# Patient Record
Sex: Male | Born: 1965 | Race: White | Hispanic: No | Marital: Married | Smoking: Former smoker
Health system: Southern US, Community
[De-identification: ages and names within clinical notes are randomized; demographics above are authoritative.]

## PROBLEM LIST (undated history)

## (undated) DIAGNOSIS — G9619 Other disorders of meninges, not elsewhere classified: Secondary | ICD-10-CM

## (undated) DIAGNOSIS — G96198 Other disorders of meninges, not elsewhere classified: Secondary | ICD-10-CM

## (undated) DIAGNOSIS — M1990 Unspecified osteoarthritis, unspecified site: Secondary | ICD-10-CM

## (undated) DIAGNOSIS — Z87442 Personal history of urinary calculi: Secondary | ICD-10-CM

## (undated) HISTORY — PX: SHOULDER ARTHROSCOPY: SHX128

## (undated) HISTORY — PX: HAND EXPLORATION: SHX1725

## (undated) HISTORY — DX: Other disorders of meninges, not elsewhere classified: G96.19

## (undated) HISTORY — PX: BACK SURGERY: SHX140

## (undated) HISTORY — DX: Other disorders of meninges, not elsewhere classified: G96.198

## (undated) HISTORY — PX: I & D EXTREMITY: SHX5045

## (undated) HISTORY — PX: FOOT SURGERY: SHX648

---

## 2000-07-17 ENCOUNTER — Ambulatory Visit (HOSPITAL_COMMUNITY): Admission: RE | Admit: 2000-07-17 | Discharge: 2000-07-17 | Payer: Self-pay | Admitting: Neurosurgery

## 2002-01-25 ENCOUNTER — Emergency Department (HOSPITAL_COMMUNITY): Admission: EM | Admit: 2002-01-25 | Discharge: 2002-01-25 | Payer: Self-pay | Admitting: Emergency Medicine

## 2015-12-08 ENCOUNTER — Emergency Department (HOSPITAL_COMMUNITY): Payer: BLUE CROSS/BLUE SHIELD

## 2015-12-08 ENCOUNTER — Encounter (HOSPITAL_COMMUNITY): Payer: Self-pay | Admitting: Emergency Medicine

## 2015-12-08 ENCOUNTER — Emergency Department (HOSPITAL_COMMUNITY)
Admission: EM | Admit: 2015-12-08 | Discharge: 2015-12-08 | Disposition: A | Discharge status: Home / Self Care | Payer: BLUE CROSS/BLUE SHIELD | Attending: Emergency Medicine | Admitting: Emergency Medicine

## 2015-12-08 DIAGNOSIS — W1830XA Fall on same level, unspecified, initial encounter: Secondary | ICD-10-CM | POA: Insufficient documentation

## 2015-12-08 DIAGNOSIS — Y939 Activity, unspecified: Secondary | ICD-10-CM | POA: Diagnosis not present

## 2015-12-08 DIAGNOSIS — M25561 Pain in right knee: Secondary | ICD-10-CM | POA: Diagnosis not present

## 2015-12-08 DIAGNOSIS — Y999 Unspecified external cause status: Secondary | ICD-10-CM | POA: Diagnosis not present

## 2015-12-08 DIAGNOSIS — Z87891 Personal history of nicotine dependence: Secondary | ICD-10-CM | POA: Diagnosis not present

## 2015-12-08 DIAGNOSIS — Y929 Unspecified place or not applicable: Secondary | ICD-10-CM | POA: Insufficient documentation

## 2015-12-08 MED ORDER — DICLOFENAC SODIUM 75 MG PO TBEC: 75.0000 mg | DELAYED_RELEASE_TABLET | Freq: Two times a day (BID) | ORAL | 0 refills | Status: DC

## 2015-12-08 MED ORDER — OXYCODONE-ACETAMINOPHEN 5-325 MG PO TABS
1.0000 | ORAL_TABLET | Freq: Once | ORAL | Status: AC
  Administered 2015-12-08: 1 via ORAL
  Filled 2015-12-08: qty 1

## 2015-12-08 NOTE — Discharge Instructions (Signed)
Apply ice packs on/off to your knee.  Wear the ACE wrap when weight bearing, do not wear it continuously.  Follow-up with the orthopedic doctor listed.

## 2015-12-08 NOTE — ED Triage Notes (Signed)
Pt reports R knee edema and pain. Pt fell yesterday after stepping off the edge of a curb. Pt states edema had started prior to his fall.

## 2015-12-08 NOTE — ED Provider Notes (Signed)
[facility] DEPT Provider Note   CSN: [id number] Arrival date & time: 12/08/15  1148   By signing my name below, I, Dolores Hoose, attest that this documentation has been prepared under the direction and in the presence of non-physician practitioner, Kem Parkinson, PA-C. Electronically Signed: Dolores Hoose, Scribe. 12/08/2015. 12:57 PM.  History   Chief Complaint Chief Complaint  Patient presents with  . Knee Pain   The history is provided by the patient. No language interpreter was used.    HPI Comments:  Edward Barrett is a 50 y.o. male with no pertinent PMHx who presents to the Emergency Department complaining of chronic waxing/waning right knee pain onset 3 days ago. Pt reports the pain feels like a "constant cramp" in the posterior aspect of his right knee. Pt states that his knee pain is baseline, but began hurting more 3 days ago. He states that it was not as bad 2 days ago, but after walking on it and falling yesterday, his pain has become particularly bad. Pt states that during his fall yesterday, he remembers hearing a pop. No modifying factors indicated. He endorses associated swelling to the area. Pt denies any numbness or weakness.   History reviewed. No pertinent past medical history.  There are no active problems to display for this patient.  Past Surgical History:  Procedure Laterality Date  . BACK SURGERY    . I&D EXTREMITY     hand  . SHOULDER ARTHROSCOPY      Home Medications    Prior to Admission medications   Medication Sig Start Date End Date Taking? Authorizing Provider  diclofenac (VOLTAREN) 75 MG EC tablet Take 1 tablet (75 mg total) by mouth 2 (two) times daily. Take with food 12/08/15   Tammy Triplett, PA-C  Oxycodone-Acetaminophen (PERCOCET PO) Take by mouth.    Historical Provider, MD    Family History Family History  Problem Relation Age of Onset  . Cancer Father   . Cancer Sister   . Diabetes Other   . Cancer Other     Social  History Social History  Substance Use Topics  . Smoking status: Former Smoker    Years: 1.50  . Smokeless tobacco: Former Systems developer  . Alcohol use No     Allergies   Review of patient's allergies indicates no known allergies.   Review of Systems Review of Systems  Musculoskeletal: Positive for arthralgias and joint swelling.  Neurological: Negative for weakness and numbness.     Physical Exam Updated Vital Signs BP 140/75 (BP Location: Left Arm)   Pulse (!) 58   Temp 99 F (37.2 C) (Oral)   Resp 16   Ht 6' (1.829 m)   Wt 180 lb (81.6 kg)   SpO2 100%   BMI 24.41 kg/m   Physical Exam  Constitutional: He is oriented to person, place, and time. He appears well-developed and well-nourished. No distress.  HENT:  Head: Normocephalic and atraumatic.  Eyes: Conjunctivae are normal.  Cardiovascular: Normal rate.   Pulmonary/Chest: Effort normal.  Abdominal: He exhibits no distension.  Musculoskeletal:  Tenderness to popliteal fossa of right knee. Mild to moderate edema without erythema or excessive warmth. Calf soft and nontender.  Neurological: He is alert and oriented to person, place, and time.  Skin: Skin is warm and dry.  Psychiatric: He has a normal mood and affect.  Nursing note and vitals reviewed.  ED Treatments / Results  DIAGNOSTIC STUDIES:  Oxygen Saturation is 100% on RA, normal by my interpretation.  COORDINATION OF CARE:  1:09 PM Discussed treatment plan with pt at bedside which includes referral to orthopedics, a brace, ice and pain management and pt agreed to plan.  Labs (all labs ordered are listed, but only abnormal results are displayed) Labs Reviewed - No data to display  EKG  EKG Interpretation None       Radiology No results found. Procedures Procedures (including critical care time)  Medications Ordered in ED Medications  oxyCODONE-acetaminophen (PERCOCET/ROXICET) 5-325 MG per tablet 1 tablet (1 tablet Oral Given 12/08/15 1343)      Initial Impression / Assessment and Plan / ED Course  I have reviewed the triage vital signs and the nursing notes.  Pertinent labs & imaging results that were available during my care of the patient were reviewed by me and considered in my medical decision making (see chart for details).  Clinical Course     Patient X-Ray negative for obvious fracture or dislocation.  Pt advised to follow up with orthopedics. Patient given brace while in ED, conservative therapy recommended and discussed. Patient will be discharged home & is agreeable with above plan. Returns precautions discussed. Pt appears safe for discharge.  Final Clinical Impressions(s) / ED Diagnoses   Final diagnoses:  Knee pain, acute, right    New Prescriptions Discharge Medication List as of 12/08/2015  1:56 PM    START taking these medications   Details  diclofenac (VOLTAREN) 75 MG EC tablet Take 1 tablet (75 mg total) by mouth 2 (two) times daily. Take with food, Starting Sun 12/08/2015, Print      I personally performed the services described in this documentation, which was scribed in my presence. The recorded information has been reviewed and is accurate.      Nat Christen, MD 12/10/15 [phone]

## 2015-12-10 ENCOUNTER — Encounter: Payer: Self-pay | Admitting: Orthopedic Surgery

## 2015-12-10 ENCOUNTER — Ambulatory Visit (INDEPENDENT_AMBULATORY_CARE_PROVIDER_SITE_OTHER): Payer: BLUE CROSS/BLUE SHIELD | Admitting: Orthopedic Surgery

## 2015-12-10 VITALS — BP 138/79 | HR 61 | Wt 175.0 lb

## 2015-12-10 DIAGNOSIS — M1120 Other chondrocalcinosis, unspecified site: Secondary | ICD-10-CM

## 2015-12-10 DIAGNOSIS — M25461 Effusion, right knee: Secondary | ICD-10-CM | POA: Diagnosis not present

## 2015-12-10 NOTE — Progress Notes (Signed)
Chief Complaint  Patient presents with  . Joint Swelling    right knee swelling   HPI 50 year old male started having some swelling in his knee which had prior been painful but no swelling. He then slipped off a curb felt a pop in his knee and started having pain swelling loss of motion and he's been experiencing symptoms now for about 2 weeks. Presents for evaluation and treatment. X-rays at the hospital show chondrocalcinosis mild arthritis   Review of Systems  Constitutional: Negative for chills and fever.  Musculoskeletal: Positive for back pain, joint pain, myalgias and neck pain.  Skin: Negative for itching and rash.    He denies any medical problems hypertension diabetes  Past Surgical History:  Procedure Laterality Date  . BACK SURGERY    . I&D EXTREMITY     hand  . SHOULDER ARTHROSCOPY     Family History  Problem Relation Age of Onset  . Cancer Father   . Cancer Sister   . Diabetes Other   . Cancer Other    Social History  Substance Use Topics  . Smoking status: Former Smoker    Years: 1.50  . Smokeless tobacco: Former Systems developer  . Alcohol use No   Current Meds  Medication Sig  . diclofenac (VOLTAREN) 75 MG EC tablet Take 1 tablet (75 mg total) by mouth 2 (two) times daily. Take with food  . Oxycodone-Acetaminophen (PERCOCET PO) Take by mouth.    BP 138/79   Pulse 61   Wt 175 lb (79.4 kg)   BMI 23.73 kg/m   Physical Exam  Constitutional: He is oriented to person, place, and time. He appears well-developed and well-nourished. No distress.  Cardiovascular: Normal rate and intact distal pulses.   Neurological: He is alert and oriented to person, place, and time.  Skin: Skin is warm and dry. No rash noted. He is not diaphoretic. No erythema. No pallor.  Psychiatric: He has a normal mood and affect. His behavior is normal. Judgment and thought content normal.    Ortho Exam Slight abnormal gait. Right knee Large knee effusion. Knee flexion 10-180 stable  strength normal skin intact no erythema pulses good distally sensation normal  Left knee full range of motion  ASSESSMENT: My personal interpretation of the images:  He does have chondrocalcinosis both knees    PLAN Aspiration injection  Follow-up with [place] as needed  Use ice 3 times a day  Procedure note injection and aspiration right knee joint  Verbal consent was obtained to aspirate and inject the right knee joint   Timeout was completed to confirm the site of aspiration and injection  An 18-gauge needle was used to aspirate the knee joint from a suprapatellar lateral approach.  The medications used were 40 mg of Depo-Medrol and 1% lidocaine 3 cc  Anesthesia was provided by ethyl chloride and the skin was prepped with alcohol.  After cleaning the skin with alcohol an 18-gauge needle was used to aspirate the right knee joint.  We obtained 70 cc of cloudy yellow fluid nonpurulent consistent with chondrocalcinosis We follow this by injection of 40 mg of Depo-Medrol and 3 cc 1% lidocaine.  There were no complications. A sterile bandage was applied.    Arther Abbott, MD 12/10/2015 11:25 AM  .meds

## 2015-12-10 NOTE — Patient Instructions (Addendum)
Calcium Pyrophosphate Deposition   Calcium pyrophosphate deposition (CPPD), which is also called pseudogout, is a type of arthritis that causes pain, swelling, and inflammation in a joint. The joint pain can be severe and may last for days. If it is not treated, the pain may last much longer. Attacks of CPPD may come and go. This condition usually affects one joint at a time. The joints that are affected most commonly are the knees, but this condition can also affect the wrists, elbows, shoulders, or ankles.  CPPD is similar to gout. Both conditions result from the buildup of crystals in the joint. However, CPPD is caused by a type of crystal that is different than the crystals that cause gout.  CAUSES  This condition is caused by the buildup of calcium pyrophosphate dihydrate crystals in the joint. The reason why this buildup occurs is not known. The condition may be passed down from parent to child (hereditary).  RISK FACTORS  This condition is more likely to develop in people who:  · Are over 60 years old.  · Have a family history of the condition.  · Have had joint replacement surgery.  · Have had a recent injury.  · Have certain medical conditions, such as hemophilia, ochronosis, amyloidosis, or hormonal disorders.  · Have low blood magnesium levels.  SYMPTOMS  Symptoms of this condition include:  · Pain in a joint. The pain may:    Be intense and constant.    Come on quickly.    Get worse with movement.    Last from several days to a few weeks.  · Redness, swelling, and warmth at the joint.  · Stiffness of the joint.  DIAGNOSIS  To diagnose this condition, your health care provider will use a needle to remove fluid from the joint. The fluid will be examined under a microscope to check for the crystals that cause CPPD. You may also have imaging tests, such as:  · X-rays.  · Ultrasound.  TREATMENT  There is no way to remove the crystals from the joint and no way to cure this condition. However, treatment can  relieve symptoms and improve joint function. Treatment may include:  · Nonsteroidal anti-inflammatory drugs (NSAIDs) to reduce inflammation and pain.  · Medicines to help prevent attacks.  · Injections of medicine (cortisone) into the joint to reduce pain and swelling.  · Physical therapy to improve joint function.  HOME CARE INSTRUCTIONS  · Take medicines only as directed by your health care provider.  · Rest the affected joints until your symptoms start to go away.  · Keep your affected joints raised (elevated) when possible. This will help to reduce swelling.  · If directed, apply ice to the affected area:    Put ice in a plastic bag.    Place a towel between your skin and the bag.    Leave the ice on for 20 minutes, 2-3 times per day.  · If the painful joint is in your leg, use crutches as directed by your health care provider.  · When your symptoms start to go away, begin to exercise regularly or do physical therapy. Talk with your health care provider or physical therapist about what types of exercise are safe for you. Low-impact exercise may be best. This includes walking, swimming, bicycling, and water aerobics.  · Maintain a healthy weight so your joints do not need to bear more weight than necessary.  SEEK MEDICAL CARE IF:  · You have an increase   any questions you have with your health care provider.   Document Released: 11/23/2003 Document Revised: 07/17/2014 Document Reviewed: 02/07/2014 Elsevier Interactive Patient Education 2016 [employer].   ICE 3 X A DAY FOR 20 MIN

## 2016-04-07 ENCOUNTER — Ambulatory Visit (HOSPITAL_COMMUNITY): Payer: BLUE CROSS/BLUE SHIELD | Attending: Orthopedic Surgery

## 2016-04-07 ENCOUNTER — Encounter (HOSPITAL_COMMUNITY): Payer: Self-pay

## 2016-04-07 DIAGNOSIS — M542 Cervicalgia: Secondary | ICD-10-CM | POA: Insufficient documentation

## 2016-04-07 DIAGNOSIS — R29898 Other symptoms and signs involving the musculoskeletal system: Secondary | ICD-10-CM | POA: Diagnosis present

## 2016-04-07 DIAGNOSIS — M25511 Pain in right shoulder: Secondary | ICD-10-CM | POA: Diagnosis present

## 2016-04-07 NOTE — Patient Instructions (Signed)
  Flexibility: Upper Trapezius Stretch   Gently grasp right side of head while reaching behind back with other hand. Tilt head away until a gentle stretch is felt. Hold _10___ seconds. Repeat __2__ times per set.   http://orth.exer.us/340   Levator Stretch   Grasp seat or sit on hand on side to be stretched. Turn head toward other side and look down. Use hand on head to gently stretch neck in that position. Hold __10__ seconds. Repeat on other side. Repeat __2__ times.   http://[email or web address].exer.us/30   Scapular Retraction (Standing)   With arms at sides, pinch shoulder blades together. Repeat __10__ times per set. Do __1__ sets per session. Do ____ sessions per day.  http://orth.exer.us/944   Flexibility: Neck Retraction   Pull head straight back, keeping eyes and jaw level. Repeat __10__ times per set. Do _1___ sets per session. Do ____ sessions per day.  http://orth.exer.us/344   Posture - Sitting   Sit upright, head facing forward. Try using a roll to support lower back. Keep shoulders relaxed, and avoid rounded back. Keep hips level with knees. Avoid crossing legs for long periods.    Doorway Stretch  Place each hand opposite each other on the doorway. (You can change where you feel the stretch by moving arms higher or lower.) Step through with one foot and bend front knee until a stretch is felt and hold. Step through with the opposite foot on the next rep. Hold for __10___ seconds. Repeat __2__times.      Posterior Capsule Stretch   Stand or sit, one arm across body so hand rests over opposite shoulder. Gently push on crossed elbow with other hand until stretch is felt in shoulder of crossed arm. Hold _10__ seconds.  Repeat _2__ times per session. Do ___ sessions per day.

## 2016-04-07 NOTE — Therapy (Signed)
[facility] [place], [place], [address] Phone: [phone]   Fax:  [phone]  Occupational Therapy Evaluation  Patient Details  Name: Edward Barrett MRN: [id number] Date of Birth: 1965-05-04 Referring Provider: Melina Schools, MD  Encounter Date: 04/07/2016      OT End of Session - 04/07/16 0903    Visit Number 1   Number of Visits 4   Date for OT Re-Evaluation 05/07/16   Authorization Type BCBS - insurance benefits unknown at time of evaluation.   OT Start Time 0818   OT Stop Time 0900   OT Time Calculation (min) 42 min   Activity Tolerance Patient tolerated treatment well   Behavior During Therapy WFL for tasks assessed/performed      History reviewed. No pertinent past medical history.  Past Surgical History:  Procedure Laterality Date  . BACK SURGERY    . I&D EXTREMITY     hand  . SHOULDER ARTHROSCOPY      There were no vitals filed for this visit.      Subjective Assessment - 04/07/16 0856    Subjective  S: My shoulder just started hurting a couple months ago.   Patient is accompained by: Family member  wife: Edward Barrett   Pertinent History Patient is a 51 y/o male S/P right shoulder and cervical pain related to DDD which began to approximately 2-3 months ago. Patient reports pain mainly at night when he is sleeping with pain ranging from 3-9/10 at night. Dr. Rolena Infante has referred patient to occupational therapy for evaluation and treatment.    Special Tests FOTO score: 65/100   Patient Stated Goals To decrease pain level.    Currently in Pain? Yes   Pain Score 1    Pain Location Shoulder   Pain Orientation Right   Pain Descriptors / Indicators Aching;Constant   Pain Type Acute pain   Pain Radiating Towards N/A   Pain Onset More than a month ago   Pain Frequency Constant   Aggravating Factors  Sleeping position   Pain Relieving Factors Unknown   Effect of Pain on Daily Activities Min effect   Multiple  Pain Sites No           [facility] OT Assessment - 04/07/16 0807      Assessment   Diagnosis RIght shoulder pain   Referring Provider Melina Schools, MD   Onset Date --  2-3 months ago   Assessment Follow up appointment scheduled for 04/09/16   Prior Therapy None for right shoulder     Precautions   Precautions None     Restrictions   Weight Bearing Restrictions No     Balance Screen   Has the patient fallen in the past 6 months Yes   How many times? 1   Has the patient had a decrease in activity level because of a fear of falling?  No   Is the patient reluctant to leave their home because of a fear of falling?  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 Strength is at baseline and functional. Pt does report difficulty with sleeping. patient is only able to sleep  on his back and he reports that pain level at night ranges from 3-9 out of 10 which impairs his ability to get a good night sleep.     Mobility   Mobility Status Independent     Written Expression   Dominant Hand Right     Vision - History   Baseline Vision Wears glasses all the time     Cognition   Overall Cognitive Status Within Functional Limits for tasks assessed     ROM / Strength   AROM / PROM / Strength Strength;PROM;AROM     Palpation   Palpation comment Max fascial restrictions in right anterior deltoid, trapezius, and scapularis region.     AROM   Overall AROM Comments Assessed seated. IR/er abducted. Cervical A/ROM is WNL.   AROM Assessment Site Shoulder   Right/Left Shoulder Right   Right Shoulder Flexion 165 Degrees   Right Shoulder ABduction 145 Degrees   Right Shoulder Internal Rotation 70 Degrees   Right  Shoulder External Rotation 85 Degrees     PROM   Overall PROM  Within functional limits for tasks performed   PROM Assessment Site Shoulder   Right/Left Shoulder Right     Strength   Overall Strength Comments Assessed seated. IR/er abducted.   Strength Assessment Site Shoulder   Right/Left Shoulder Right   Right Shoulder Flexion 5/5   Right Shoulder ABduction 5/5   Right Shoulder Internal Rotation 5/5   Right Shoulder External Rotation 5/5                         OT Education - 04/07/16 0901    Education provided Yes   Education Details cervical and shoulder stretches   Person(s) Educated Patient;Spouse   Methods Explanation;Demonstration;Verbal cues;Handout   Comprehension Returned demonstration;Verbalized understanding          OT Short Term Goals - 04/07/16 0935      OT SHORT TERM GOAL #1   Title Patient will be educated and independent with HEP to increase comfort level when sleeping.   Time 4   Period Weeks   Status New     OT SHORT TERM GOAL #2   Title Patient will be educated on proper Arts development officer and posture when competing daily and work related tasks.   Time 4   Period Weeks   Status New     OT SHORT TERM GOAL #3   Title Patient will report a decreased pain level when sleeping that will allow him to be able to sleep comfortably for 50% of the night or more.   Time 4   Period Weeks   Status New     OT SHORT TERM GOAL #4   Title Patient will decrease fascial restrictions in RUE to min amount or less to help decrease pain level when sleeping.    Time 4   Period Weeks   Status New                  Plan - 04/07/16 0904    Clinical Impression Statement A: Patient is a 51 y/o male S/P right shoulder and cervical pain causing increased fascial restrictions and pain which is resulting in difficulty gaining a comfortable position when sleeping. UB strength and ROM is functional.   Rehab Potential Excellent   Clinical Impairments  Affecting Rehab Potential Patient has a history of left shoulder arthroscopy and has limited shoulder mobility at baseline. Hx of risk for left shoulder dislocation.  OT Frequency 1x / week   OT Duration 4 weeks   OT Treatment/Interventions Self-care/ADL training;Therapeutic exercise;Patient/family education;Manual Therapy;Ultrasound;Iontophoresis;Therapeutic activities;DME and/or AE instruction;Cryotherapy;Electrical Stimulation;Moist Heat;Passive range of motion   Plan P: Patient will benefit from skilled OT services to increase functional performance and comfort level during daily/work related tasks and allow for an increased comfort level at night when sleeping. Treatment Plan: Myofascial release to right UE and right side cervical region. Complete manual stretching, Cervical and shoulder stretching follow up with HEP. Education on Designer, fashion/clothing at work.   OT Home Exercise Plan 1/23: cervical and shoulder stretches.   Consulted and Agree with Plan of Care Patient;Family member/caregiver   Family Member Consulted Wife, Edward Barrett.      Patient will benefit from skilled therapeutic intervention in order to improve the following deficits and impairments:  Pain, Increased fascial restricitons  Visit Diagnosis: Cervicalgia - Plan: Ot plan of care cert/re-cert  Acute pain of right shoulder - Plan: Ot plan of care cert/re-cert  Other symptoms and signs involving the musculoskeletal system - Plan: Ot plan of care cert/re-cert    Problem List There are no active problems to display for this patient.  Edward Barrett, OTR/L,CBIS  [phone]  04/07/2016, 9:49 AM  [facility] [address] [place], [place], [address] Phone: [phone]   Fax:  [phone]  Name: CAMRYN EISENREICH MRN: [id number] Date of Birth: 1965-04-05

## 2016-04-08 ENCOUNTER — Ambulatory Visit (HOSPITAL_COMMUNITY): Payer: BLUE CROSS/BLUE SHIELD

## 2016-04-14 ENCOUNTER — Telehealth (HOSPITAL_COMMUNITY): Payer: Self-pay | Admitting: Family Medicine

## 2016-04-14 NOTE — Telephone Encounter (Signed)
04/14/16 wife called to cx the 1/31 appointment.  She said that it wasn't going to be a good day to come like they thought it would be

## 2016-04-15 ENCOUNTER — Ambulatory Visit (HOSPITAL_COMMUNITY): Payer: BLUE CROSS/BLUE SHIELD | Admitting: Occupational Therapy

## 2016-04-17 ENCOUNTER — Telehealth (HOSPITAL_COMMUNITY): Payer: Self-pay | Admitting: Family Medicine

## 2016-04-17 NOTE — Telephone Encounter (Signed)
04/17/16 Beth texted me asking me to move from 9am to 1:45 and put on Laura's schedule.  I made the change and called the patient and left a message letting him know about the change and asking that he please call back to confirm.    MM

## 2016-04-20 ENCOUNTER — Ambulatory Visit (HOSPITAL_COMMUNITY): Payer: BLUE CROSS/BLUE SHIELD

## 2016-04-20 ENCOUNTER — Telehealth (HOSPITAL_COMMUNITY): Payer: Self-pay | Admitting: Family Medicine

## 2016-04-20 NOTE — Telephone Encounter (Signed)
04/20/16 wife called to say that he couldn't do the 1:45 today so we rescheduled to 8:15 on 2/7

## 2016-04-22 ENCOUNTER — Encounter (HOSPITAL_COMMUNITY): Payer: Self-pay

## 2016-04-22 ENCOUNTER — Ambulatory Visit (HOSPITAL_COMMUNITY): Payer: BLUE CROSS/BLUE SHIELD | Attending: Orthopedic Surgery

## 2016-04-22 DIAGNOSIS — M25511 Pain in right shoulder: Secondary | ICD-10-CM

## 2016-04-22 DIAGNOSIS — R29898 Other symptoms and signs involving the musculoskeletal system: Secondary | ICD-10-CM | POA: Diagnosis present

## 2016-04-22 DIAGNOSIS — M542 Cervicalgia: Secondary | ICD-10-CM | POA: Insufficient documentation

## 2016-04-22 NOTE — Therapy (Addendum)
[place], [place], [address] Phone: [phone]   Fax:  [phone]  Occupational Therapy Treatment  Patient Details  Name: Edward Barrett MRN: [id number] Date of Birth: 08/09/1965 Referring Provider: Melina Schools, MD  Encounter Date: 04/22/2016      OT End of Session - 04/22/16 0903    Visit Number 2   Number of Visits 4   Date for OT Re-Evaluation 05/07/16   Authorization Type BCBS - insurance benefits unknown at time of evaluation.   OT Start Time [phone]   OT Stop Time 0900   OT Time Calculation (min) 44 min   Activity Tolerance Patient tolerated treatment well   Behavior During Therapy [facility] for tasks assessed/performed      History reviewed. No pertinent past medical history.  Past Surgical History:  Procedure Laterality Date  . BACK SURGERY    . I&D EXTREMITY     hand  . SHOULDER ARTHROSCOPY      There were no vitals filed for this visit.      Subjective Assessment - 04/22/16 0835    Subjective  S: I haven't been doing my exercises as consistantly as I should because of the two jobs.    Currently in Pain? No/denies            [facility] OT Assessment - 04/22/16 1448      Assessment   Diagnosis RIght shoulder pain     Precautions   Precautions None                  OT Treatments/Exercises (OP) - 04/22/16 0838      Exercises   Exercises Shoulder;Neck     Neck Exercises: Stretches   Upper Trapezius Stretch 2 reps;10 seconds     Neck Exercises: Seated   Money 10 reps     Shoulder Exercises: Standing   Extension Theraband;12 reps   Theraband Level (Shoulder Extension) Level 3 (Green)   Retraction Theraband;12 reps   Theraband Level (Shoulder Retraction) Level 3 (Green)   Other Standing Exercises Y arms facing wall; 10X     Shoulder Exercises: ROM/Strengthening   UBE (Upper Arm Bike) Level 3 3' reverse only   X to V Arms 10X with right only     Shoulder Exercises:  Stretch   Other Shoulder Stretches --   Other Shoulder Stretches Doorway stretch; 10 second holds 2 times.                OT Education - 04/22/16 [phone]    Education provided Yes   Education Details Patient was given print out of OT evaluation. Reviewed goals and plan of care.   Person(s) Educated Patient   Methods Explanation;Handout   Comprehension Verbalized understanding          OT Short Term Goals - 04/22/16 0907      OT SHORT TERM GOAL #1   Title Patient will be educated and independent with HEP to increase comfort level when sleeping.   Time 4   Period Weeks   Status On-going     OT SHORT TERM GOAL #2   Title Patient will be educated on proper Arts development officer and posture when competing daily and work related tasks.   Time 4   Period Weeks   Status On-going     OT SHORT TERM GOAL #3   Title Patient will report a decreased pain level when sleeping that will allow him to be able to  sleep comfortably for 50% of the night or more.   Time 4   Period Weeks   Status On-going     OT SHORT TERM GOAL #4   Title Patient will decrease fascial restrictions in RUE to min amount or less to help decrease pain level when sleeping.    Time 4   Period Weeks   Status On-going                  Plan - 04/22/16 8280    Clinical Impression Statement A: Patient is unable to complete prone exercises due to back pain. Encouraged patient to make more of an effort to complete stretches as he is only completing therapy once a week. Patient reports more pain on left side of shoulder versus right side this session. Patient with max fascial restrictions in right upper trapezius and anterior deltoid region. VC needed for form and technique. Session focused on increasing shoulder retraction to promote good posture during work tasks and decrease amount of weight on cervical region.   Plan P: Follow up on HEP completion. Continue to focus on decreasing fascial restrictions. Work on  good Neurosurgeon.       Patient will benefit from skilled therapeutic intervention in order to improve the following deficits and impairments:  Pain, Increased fascial restricitons  Visit Diagnosis: Cervicalgia  Acute pain of right shoulder  Other symptoms and signs involving the musculoskeletal system    Problem List There are no active problems to display for this patient.    Ailene Ravel, OTR/L,CBIS  [phone]  04/22/2016, 9:09 AM  [place] [address] [place], [place], [address] Phone: [phone]   Fax:  [phone]  Name: Edward Barrett MRN: [id number] Date of Birth: 30-Sep-1965   OCCUPATIONAL THERAPY DISCHARGE SUMMARY  Visits from Start of Care: 2  Current functional level related to goals / functional outcomes: No goals met. Patient did not return after second treatment session. No reason given.   Remaining deficits: All deficits remain.   Education / Equipment: See above Plan: Patient agrees to discharge.  Patient goals were not met. Patient is being discharged due to not returning since the last visit.  ?????         Ailene Ravel, OTR/L,CBIS  [phone]

## 2016-04-27 ENCOUNTER — Ambulatory Visit (HOSPITAL_COMMUNITY): Payer: BLUE CROSS/BLUE SHIELD | Admitting: Specialist

## 2016-05-05 ENCOUNTER — Ambulatory Visit (HOSPITAL_COMMUNITY): Payer: BLUE CROSS/BLUE SHIELD | Admitting: Specialist

## 2016-08-14 ENCOUNTER — Ambulatory Visit: Payer: Self-pay | Admitting: Physician Assistant

## 2016-08-18 ENCOUNTER — Ambulatory Visit: Payer: Self-pay | Admitting: Physician Assistant

## 2016-08-20 ENCOUNTER — Ambulatory Visit: Payer: Self-pay | Admitting: Physician Assistant

## 2016-09-01 NOTE — Pre-Procedure Instructions (Signed)
Edward Barrett  09/01/2016      [name] - [name], [name] [address] Phone: [phone] Fax: [phone]    Your procedure is scheduled on June 28  Report to [place] at [place].M.  Call this number if you have problems the morning of surgery:  [phone]   Remember:  Do not eat food or drink liquids after midnight.   Take these medicines the morning of surgery with A SIP OF WATER Oxycodone if needed  7 days prior to surgery STOP taking any Aspirin, Aleve, Naproxen, Ibuprofen, Motrin, Advil, Goody's, BC's, all herbal medications, fish oil, and all vitamins    Do not wear jewelry  Do not wear lotions, powders, or cologne, or deoderant.   Men may shave face and neck.  Do not bring valuables to the hospital.  [facility] is not responsible for any belongings or valuables.  Contacts, dentures or bridgework may not be worn into surgery.  Leave your suitcase in the car.  After surgery it may be brought to your room.  For patients admitted to the hospital, discharge time will be determined by your treatment team.  Patients discharged the day of surgery will not be allowed to drive home.    Special instructions:   [place]- Preparing For Surgery  Before surgery, you can play an important role. Because skin is not sterile, your skin needs to be as free of germs as possible. You can reduce the number of germs on your skin by washing with CHG (chlorahexidine gluconate) Soap before surgery.  CHG is an antiseptic cleaner which kills germs and bonds with the skin to continue killing germs even after washing.  Please do not use if you have an allergy to CHG or antibacterial soaps. If your skin becomes reddened/irritated stop using the CHG.  Do not shave (including legs and underarms) for at least 48 hours prior to first CHG shower. It is OK to shave your face.  Please follow these instructions  carefully.   1. Shower the NIGHT BEFORE SURGERY and the MORNING OF SURGERY with CHG.   2. If you chose to wash your hair, wash your hair first as usual with your normal shampoo.  3. After you shampoo, rinse your hair and body thoroughly to remove the shampoo.  4. Use CHG as you would any other liquid soap. You can apply CHG directly to the skin and wash gently with a scrungie or a clean washcloth.   5. Apply the CHG Soap to your body ONLY FROM THE NECK DOWN.  Do not use on open wounds or open sores. Avoid contact with your eyes, ears, mouth and genitals (private parts). Wash genitals (private parts) with your normal soap.  6. Wash thoroughly, paying special attention to the area where your surgery will be performed.  7. Thoroughly rinse your body with warm water from the neck down.  8. DO NOT shower/wash with your normal soap after using and rinsing off the CHG Soap.  9. Pat yourself dry with a CLEAN TOWEL.   10. Wear CLEAN PAJAMAS   11. Place CLEAN SHEETS on your bed the night of your first shower and DO NOT SLEEP WITH PETS.    Day of Surgery: Do not apply any deodorants/lotions. Please wear clean clothes to the hospital/surgery center.      Please read over the following fact sheets that you were given.

## 2016-09-02 ENCOUNTER — Other Ambulatory Visit (HOSPITAL_COMMUNITY): Payer: Self-pay | Admitting: *Deleted

## 2016-09-02 ENCOUNTER — Encounter (HOSPITAL_COMMUNITY)
Admission: RE | Admit: 2016-09-02 | Discharge: 2016-09-02 | Disposition: A | Discharge status: Home / Self Care | Payer: BLUE CROSS/BLUE SHIELD | Source: Ambulatory Visit | Attending: Orthopedic Surgery | Admitting: Orthopedic Surgery

## 2016-09-02 ENCOUNTER — Encounter (HOSPITAL_COMMUNITY): Payer: Self-pay

## 2016-09-02 DIAGNOSIS — Z01812 Encounter for preprocedural laboratory examination: Secondary | ICD-10-CM | POA: Diagnosis not present

## 2016-09-02 DIAGNOSIS — M50122 Cervical disc disorder at C5-C6 level with radiculopathy: Secondary | ICD-10-CM | POA: Insufficient documentation

## 2016-09-02 DIAGNOSIS — Z01810 Encounter for preprocedural cardiovascular examination: Secondary | ICD-10-CM | POA: Insufficient documentation

## 2016-09-02 DIAGNOSIS — R001 Bradycardia, unspecified: Secondary | ICD-10-CM | POA: Diagnosis not present

## 2016-09-02 DIAGNOSIS — M4802 Spinal stenosis, cervical region: Secondary | ICD-10-CM | POA: Diagnosis not present

## 2016-09-02 HISTORY — DX: Unspecified osteoarthritis, unspecified site: M19.90

## 2016-09-02 LAB — SURGICAL PCR SCREEN
MRSA, PCR: NEGATIVE
Staphylococcus aureus: NEGATIVE

## 2016-09-02 LAB — CBC
HEMATOCRIT: 43.9 % (ref 39.0–52.0)
HEMOGLOBIN: 14.9 g/dL (ref 13.0–17.0)
MCH: 30.8 pg (ref 26.0–34.0)
MCHC: 33.9 g/dL (ref 30.0–36.0)
MCV: 90.7 fL (ref 78.0–100.0)
Platelets: 183 10*3/uL (ref 150–400)
RBC: 4.84 MIL/uL (ref 4.22–5.81)
RDW: 13.3 % (ref 11.5–15.5)
WBC: 6 10*3/uL (ref 4.0–10.5)

## 2016-09-02 LAB — BASIC METABOLIC PANEL
Anion gap: 6 (ref 5–15)
BUN: 15 mg/dL (ref 6–20)
CHLORIDE: 103 mmol/L (ref 101–111)
CO2: 28 mmol/L (ref 22–32)
Calcium: 9 mg/dL (ref 8.9–10.3)
Creatinine, Ser: 0.93 mg/dL (ref 0.61–1.24)
GFR calc Af Amer: >60 mL/min (ref >60)
GFR calc non Af Amer: >60 mL/min (ref >60)
Glucose, Bld: 111 mg/dL — ABNORMAL HIGH (ref 65–99)
POTASSIUM: 4 mmol/L (ref 3.5–5.1)
Sodium: 137 mmol/L (ref 135–145)

## 2016-09-02 NOTE — Pre-Procedure Instructions (Addendum)
Edward Barrett  09/02/2016      [facility] - [place], [place] Edward Barrett [address] Phone: [phone] Fax: [phone]    Your procedure is scheduled on June 28  Report to Edward Barrett at [place].M.  Call this number if you have problems the morning of surgery:  [phone]   Remember:  Do not eat food or drink liquids after midnight.   Take these medicines the morning of surgery with A SIP OF WATER Oxycodone if needed  7 days prior to surgery starting 09/03/16  STOP taking any Aspirin, Aleve, Naproxen, Ibuprofen, Motrin, Advil, Goody's, BC's, all herbal medications, fish oil, and all vitamins    Do not wear jewelry  Do not wear lotions, powders, or cologne, or deoderant.   Men may shave face and neck.  Do not bring valuables to the hospital.  [name] is not responsible for any belongings or valuables.  Contacts, dentures or bridgework may not be worn into surgery.  Leave your suitcase in the car.  After surgery it may be brought to your room.  For patients admitted to the hospital, discharge time will be determined by your treatment team.  Patients discharged the day of surgery will not be allowed to drive home.    Special instructions:   [place]- Preparing For Surgery  Before surgery, you can play an important role. Because skin is not sterile, your skin needs to be as free of germs as possible. You can reduce the number of germs on your skin by washing with CHG (chlorahexidine gluconate) Soap before surgery.  CHG is an antiseptic cleaner which kills germs and bonds with the skin to continue killing germs even after washing.  Please do not use if you have an allergy to CHG or antibacterial soaps. If your skin becomes reddened/irritated stop using the CHG.  Do not shave (including legs and underarms) for at least 48 hours prior to first CHG shower. It is OK to shave your face.  Please follow these  instructions carefully.   1. Shower the NIGHT BEFORE SURGERY and the MORNING OF SURGERY with CHG.   2. If you chose to wash your hair, wash your hair first as usual with your normal shampoo.  3. After you shampoo, rinse your hair and body thoroughly to remove the shampoo.  4. Use CHG as you would any other liquid soap. You can apply CHG directly to the skin and wash gently with a scrungie or a clean washcloth.   5. Apply the CHG Soap to your body ONLY FROM THE NECK DOWN.  Do not use on open wounds or open sores. Avoid contact with your eyes, ears, mouth and genitals (private parts). Wash genitals (private parts) with your normal soap.  6. Wash thoroughly, paying special attention to the area where your surgery will be performed.  7. Thoroughly rinse your body with warm water from the neck down.  8. DO NOT shower/wash with your normal soap after using and rinsing off the CHG Soap.  9. Pat yourself dry with a CLEAN TOWEL.   10. Wear CLEAN PAJAMAS   11. Place CLEAN SHEETS on your bed the night of your first shower and DO NOT SLEEP WITH PETS.    Day of Surgery: Do not apply any deodorants/lotions. Please wear clean clothes to the hospital/surgery center.      Please read over the following fact sheets that you were  given.

## 2016-09-03 NOTE — Progress Notes (Signed)
Anesthesia Chart Review: Chart reviewed at the request of Dr. Rolena Infante.   Pt is a 51 year old male scheduled for C5-6 ACDF on 09/10/2016 with Melina Schools, MD  - PCP is Matthias Hughs, MD  PMH includes:  Arthritis. Former smoker. BMI 25  Medications reviewed.   Preoperative labs reviewed.    EKG 09/02/16: Sinus bradycardia (51 bpm)  If no changes, I anticipate pt can proceed with surgery as scheduled.   Willeen Cass, FNP-BC [facility] Stay [facility]/Anesthesiology Phone: [phone] 09/03/2016 1:04 PM

## 2016-09-10 ENCOUNTER — Ambulatory Visit (HOSPITAL_COMMUNITY): Payer: BLUE CROSS/BLUE SHIELD | Admitting: Anesthesiology

## 2016-09-10 ENCOUNTER — Ambulatory Visit (HOSPITAL_COMMUNITY): Payer: BLUE CROSS/BLUE SHIELD | Admitting: Emergency Medicine

## 2016-09-10 ENCOUNTER — Encounter (HOSPITAL_COMMUNITY)
Admission: RE | Disposition: A | Discharge status: Home / Self Care | Payer: Self-pay | Source: Ambulatory Visit | Attending: Orthopedic Surgery

## 2016-09-10 ENCOUNTER — Observation Stay (HOSPITAL_COMMUNITY)
Admission: RE | Admit: 2016-09-10 | Discharge: 2016-09-11 | Disposition: A | Discharge status: Home / Self Care | Payer: BLUE CROSS/BLUE SHIELD | Source: Ambulatory Visit | Attending: Orthopedic Surgery | Admitting: Orthopedic Surgery

## 2016-09-10 ENCOUNTER — Ambulatory Visit (HOSPITAL_COMMUNITY): Payer: BLUE CROSS/BLUE SHIELD

## 2016-09-10 ENCOUNTER — Observation Stay (HOSPITAL_COMMUNITY): Payer: BLUE CROSS/BLUE SHIELD

## 2016-09-10 DIAGNOSIS — Z419 Encounter for procedure for purposes other than remedying health state, unspecified: Secondary | ICD-10-CM

## 2016-09-10 DIAGNOSIS — M4802 Spinal stenosis, cervical region: Principal | ICD-10-CM | POA: Insufficient documentation

## 2016-09-10 DIAGNOSIS — M1990 Unspecified osteoarthritis, unspecified site: Secondary | ICD-10-CM | POA: Insufficient documentation

## 2016-09-10 DIAGNOSIS — Z87891 Personal history of nicotine dependence: Secondary | ICD-10-CM | POA: Insufficient documentation

## 2016-09-10 DIAGNOSIS — Z981 Arthrodesis status: Secondary | ICD-10-CM

## 2016-09-10 DIAGNOSIS — Z79899 Other long term (current) drug therapy: Secondary | ICD-10-CM | POA: Diagnosis not present

## 2016-09-10 DIAGNOSIS — M542 Cervicalgia: Secondary | ICD-10-CM | POA: Diagnosis present

## 2016-09-10 HISTORY — PX: ANTERIOR CERVICAL DECOMP/DISCECTOMY FUSION: SHX1161

## 2016-09-10 SURGERY — ANTERIOR CERVICAL DECOMPRESSION/DISCECTOMY FUSION 1 LEVEL
Anesthesia: General | Site: Spine Cervical

## 2016-09-10 MED ORDER — CEFAZOLIN SODIUM-DEXTROSE 2-4 GM/100ML-% IV SOLN
2.0000 g | INTRAVENOUS | Status: AC
  Administered 2016-09-10: 2 g via INTRAVENOUS

## 2016-09-10 MED ORDER — OXYCODONE-ACETAMINOPHEN 10-325 MG PO TABS: 1.0000 | ORAL_TABLET | ORAL | 0 refills | Status: DC | PRN

## 2016-09-10 MED ORDER — LACTATED RINGERS IV SOLN: INTRAVENOUS | Status: DC

## 2016-09-10 MED ORDER — ONDANSETRON HCL 4 MG PO TABS: 4.0000 mg | ORAL_TABLET | Freq: Four times a day (QID) | ORAL | Status: DC | PRN

## 2016-09-10 MED ORDER — MENTHOL 3 MG MT LOZG: 1.0000 | LOZENGE | OROMUCOSAL | Status: DC | PRN

## 2016-09-10 MED ORDER — ACETAMINOPHEN 10 MG/ML IV SOLN
INTRAVENOUS | Status: DC | PRN
  Administered 2016-09-10: 1000 mg via INTRAVENOUS

## 2016-09-10 MED ORDER — ACETAMINOPHEN 650 MG RE SUPP: 650.0000 mg | RECTAL | Status: DC | PRN

## 2016-09-10 MED ORDER — SODIUM CHLORIDE 0.9% FLUSH: 3.0000 mL | Freq: Two times a day (BID) | INTRAVENOUS | Status: DC

## 2016-09-10 MED ORDER — FENTANYL CITRATE (PF) 100 MCG/2ML IJ SOLN
INTRAMUSCULAR | Status: DC | PRN
  Administered 2016-09-10: 100 ug via INTRAVENOUS
  Administered 2016-09-10: 50 ug via INTRAVENOUS
  Administered 2016-09-10: 100 ug via INTRAVENOUS

## 2016-09-10 MED ORDER — SODIUM CHLORIDE 0.9% FLUSH: 3.0000 mL | INTRAVENOUS | Status: DC | PRN

## 2016-09-10 MED ORDER — MORPHINE SULFATE (PF) 4 MG/ML IV SOLN: 2.0000 mg | INTRAVENOUS | Status: DC | PRN

## 2016-09-10 MED ORDER — METHOCARBAMOL 500 MG PO TABS
500.0000 mg | ORAL_TABLET | Freq: Four times a day (QID) | ORAL | Status: DC | PRN
  Administered 2016-09-10 – 2016-09-11 (×3): 500 mg via ORAL
  Filled 2016-09-10 (×2): qty 1

## 2016-09-10 MED ORDER — CEFAZOLIN SODIUM-DEXTROSE 2-4 GM/100ML-% IV SOLN
INTRAVENOUS | Status: AC
  Filled 2016-09-10: qty 100

## 2016-09-10 MED ORDER — ROCURONIUM BROMIDE 100 MG/10ML IV SOLN
INTRAVENOUS | Status: DC | PRN
  Administered 2016-09-10: 60 mg via INTRAVENOUS

## 2016-09-10 MED ORDER — HYDROMORPHONE HCL 1 MG/ML IJ SOLN
INTRAMUSCULAR | Status: AC
  Filled 2016-09-10: qty 0.5

## 2016-09-10 MED ORDER — LIDOCAINE HCL (CARDIAC) 20 MG/ML IV SOLN
INTRAVENOUS | Status: DC | PRN
  Administered 2016-09-10: 60 mg via INTRAVENOUS

## 2016-09-10 MED ORDER — GLYCOPYRROLATE 0.2 MG/ML IJ SOLN
INTRAMUSCULAR | Status: DC | PRN
  Administered 2016-09-10: 0.2 mg via INTRAVENOUS

## 2016-09-10 MED ORDER — ACETAMINOPHEN 325 MG PO TABS: 650.0000 mg | ORAL_TABLET | ORAL | Status: DC | PRN

## 2016-09-10 MED ORDER — DEXAMETHASONE SODIUM PHOSPHATE 4 MG/ML IJ SOLN: 4.0000 mg | Freq: Four times a day (QID) | INTRAMUSCULAR | Status: AC

## 2016-09-10 MED ORDER — THROMBIN 20000 UNITS EX SOLR
CUTANEOUS | Status: DC | PRN
  Administered 2016-09-10: 20 mL via TOPICAL

## 2016-09-10 MED ORDER — ACETAMINOPHEN 10 MG/ML IV SOLN
INTRAVENOUS | Status: AC
  Filled 2016-09-10: qty 100

## 2016-09-10 MED ORDER — OXYCODONE HCL 5 MG PO TABS
ORAL_TABLET | ORAL | Status: AC
  Administered 2016-09-10: 10 mg via ORAL
  Filled 2016-09-10: qty 2

## 2016-09-10 MED ORDER — DEXAMETHASONE 4 MG PO TABS
4.0000 mg | ORAL_TABLET | Freq: Four times a day (QID) | ORAL | Status: AC
  Administered 2016-09-10 – 2016-09-11 (×3): 4 mg via ORAL
  Filled 2016-09-10 (×3): qty 1

## 2016-09-10 MED ORDER — PHENOL 1.4 % MT LIQD: 1.0000 | OROMUCOSAL | Status: DC | PRN

## 2016-09-10 MED ORDER — DEXAMETHASONE SODIUM PHOSPHATE 10 MG/ML IJ SOLN
INTRAMUSCULAR | Status: DC | PRN
  Administered 2016-09-10: 10 mg via INTRAVENOUS

## 2016-09-10 MED ORDER — MIDAZOLAM HCL 2 MG/2ML IJ SOLN
INTRAMUSCULAR | Status: AC
  Filled 2016-09-10: qty 2

## 2016-09-10 MED ORDER — ONDANSETRON HCL 4 MG/2ML IJ SOLN
INTRAMUSCULAR | Status: DC | PRN
  Administered 2016-09-10: 4 mg via INTRAVENOUS

## 2016-09-10 MED ORDER — ONDANSETRON HCL 4 MG/2ML IJ SOLN: 4.0000 mg | Freq: Four times a day (QID) | INTRAMUSCULAR | Status: DC | PRN

## 2016-09-10 MED ORDER — METHOCARBAMOL 500 MG PO TABS: 500.0000 mg | ORAL_TABLET | Freq: Three times a day (TID) | ORAL | 0 refills | Status: DC | PRN

## 2016-09-10 MED ORDER — 0.9 % SODIUM CHLORIDE (POUR BTL) OPTIME
TOPICAL | Status: DC | PRN
  Administered 2016-09-10: 1000 mL

## 2016-09-10 MED ORDER — PROPOFOL 10 MG/ML IV BOLUS
INTRAVENOUS | Status: DC | PRN
  Administered 2016-09-10: 200 mg via INTRAVENOUS

## 2016-09-10 MED ORDER — CEFAZOLIN SODIUM-DEXTROSE 2-4 GM/100ML-% IV SOLN
2.0000 g | Freq: Three times a day (TID) | INTRAVENOUS | Status: AC
  Administered 2016-09-10 – 2016-09-11 (×2): 2 g via INTRAVENOUS
  Filled 2016-09-10 (×2): qty 100

## 2016-09-10 MED ORDER — BUPIVACAINE-EPINEPHRINE (PF) 0.25% -1:200000 IJ SOLN
INTRAMUSCULAR | Status: AC
  Filled 2016-09-10: qty 30

## 2016-09-10 MED ORDER — HYDROMORPHONE HCL 1 MG/ML IJ SOLN
0.2500 mg | INTRAMUSCULAR | Status: DC | PRN
  Administered 2016-09-10 (×2): 0.5 mg via INTRAVENOUS

## 2016-09-10 MED ORDER — OXYCODONE HCL 5 MG PO TABS
10.0000 mg | ORAL_TABLET | ORAL | Status: DC | PRN
  Administered 2016-09-10 – 2016-09-11 (×4): 10 mg via ORAL
  Filled 2016-09-10 (×3): qty 2

## 2016-09-10 MED ORDER — METHOCARBAMOL 500 MG PO TABS
ORAL_TABLET | ORAL | Status: AC
  Administered 2016-09-10: 500 mg via ORAL
  Filled 2016-09-10: qty 1

## 2016-09-10 MED ORDER — BUPIVACAINE-EPINEPHRINE 0.25% -1:200000 IJ SOLN
INTRAMUSCULAR | Status: DC | PRN
  Administered 2016-09-10: 3 mL

## 2016-09-10 MED ORDER — METHOCARBAMOL 1000 MG/10ML IJ SOLN
500.0000 mg | Freq: Four times a day (QID) | INTRAMUSCULAR | Status: DC | PRN
  Filled 2016-09-10: qty 5

## 2016-09-10 MED ORDER — SUGAMMADEX SODIUM 200 MG/2ML IV SOLN
INTRAVENOUS | Status: DC | PRN
  Administered 2016-09-10: 200 mg via INTRAVENOUS

## 2016-09-10 MED ORDER — HYDROMORPHONE HCL 1 MG/ML IJ SOLN
INTRAMUSCULAR | Status: AC
  Administered 2016-09-10: 0.5 mg via INTRAVENOUS
  Filled 2016-09-10: qty 0.5

## 2016-09-10 MED ORDER — ONDANSETRON HCL 4 MG PO TABS: 4.0000 mg | ORAL_TABLET | Freq: Three times a day (TID) | ORAL | 0 refills | Status: DC | PRN

## 2016-09-10 MED ORDER — MIDAZOLAM HCL 5 MG/5ML IJ SOLN
INTRAMUSCULAR | Status: DC | PRN
  Administered 2016-09-10: 2 mg via INTRAVENOUS

## 2016-09-10 MED ORDER — PHENYLEPHRINE HCL 10 MG/ML IJ SOLN
INTRAVENOUS | Status: DC | PRN
  Administered 2016-09-10: 50 ug/min via INTRAVENOUS

## 2016-09-10 MED ORDER — FENTANYL CITRATE (PF) 250 MCG/5ML IJ SOLN
INTRAMUSCULAR | Status: AC
  Filled 2016-09-10: qty 5

## 2016-09-10 MED ORDER — PROMETHAZINE HCL 25 MG/ML IJ SOLN: 6.2500 mg | INTRAMUSCULAR | Status: DC | PRN

## 2016-09-10 MED ORDER — POLYETHYLENE GLYCOL 3350 17 G PO PACK: 17.0000 g | PACK | Freq: Every day | ORAL | Status: DC | PRN

## 2016-09-10 MED ORDER — PROPOFOL 10 MG/ML IV BOLUS
INTRAVENOUS | Status: AC
  Filled 2016-09-10: qty 20

## 2016-09-10 MED ORDER — THROMBIN 20000 UNITS EX SOLR
CUTANEOUS | Status: AC
  Filled 2016-09-10: qty 20000

## 2016-09-10 MED ORDER — LACTATED RINGERS IV SOLN
INTRAVENOUS | Status: DC
  Administered 2016-09-10 (×2): via INTRAVENOUS

## 2016-09-10 MED ORDER — SODIUM CHLORIDE 0.9 % IV SOLN: 250.0000 mL | INTRAVENOUS | Status: DC

## 2016-09-10 SURGICAL SUPPLY — 63 items
BLADE CLIPPER SURG (BLADE) IMPLANT
BONE VIVIGEN FORMABLE 1.3CC (Bone Implant) ×3 IMPLANT
CANISTER SUCT 3000ML PPV (MISCELLANEOUS) ×3 IMPLANT
CLOSURE STERI-STRIP 1/2X4 (GAUZE/BANDAGES/DRESSINGS) ×1
CLSR STERI-STRIP ANTIMIC 1/2X4 (GAUZE/BANDAGES/DRESSINGS) ×2 IMPLANT
CORDS BIPOLAR (ELECTRODE) ×3 IMPLANT
COVER SURGICAL LIGHT HANDLE (MISCELLANEOUS) ×6 IMPLANT
CRADLE DONUT ADULT HEAD (MISCELLANEOUS) ×3 IMPLANT
DEVICE EDNSKLTN TC NNLCK MED 8 (Cage) IMPLANT
DRAPE C-ARM 42X72 X-RAY (DRAPES) ×3 IMPLANT
DRAPE POUCH INSTRU U-SHP 10X18 (DRAPES) ×3 IMPLANT
DRAPE SURG 17X23 STRL (DRAPES) ×3 IMPLANT
DRAPE U-SHAPE 47X51 STRL (DRAPES) ×3 IMPLANT
DRSG AQUACEL AG ADV 3.5X 4 (GAUZE/BANDAGES/DRESSINGS) ×3 IMPLANT
DRSG OPSITE POSTOP 3X4 (GAUZE/BANDAGES/DRESSINGS) ×2 IMPLANT
DURAPREP 6ML APPLICATOR 50/CS (WOUND CARE) ×3 IMPLANT
ELECT COATED BLADE 2.86 ST (ELECTRODE) ×5 IMPLANT
ELECT PENCIL ROCKER SW 15FT (MISCELLANEOUS) ×3 IMPLANT
ELECT REM PT RETURN 9FT ADLT (ELECTROSURGICAL) ×3
ELECTRODE REM PT RTRN 9FT ADLT (ELECTROSURGICAL) ×1 IMPLANT
ENDOSKELTON IMPLANT TC MED 8MM (Cage) ×3 IMPLANT
GLOVE BIO SURGEON STRL SZ 6.5 (GLOVE) ×2 IMPLANT
GLOVE BIO SURGEONS STRL SZ 6.5 (GLOVE) ×1
GLOVE BIOGEL PI IND STRL 6.5 (GLOVE) ×1 IMPLANT
GLOVE BIOGEL PI IND STRL 8.5 (GLOVE) ×1 IMPLANT
GLOVE BIOGEL PI INDICATOR 6.5 (GLOVE) ×2
GLOVE BIOGEL PI INDICATOR 8.5 (GLOVE) ×2
GLOVE SS BIOGEL STRL SZ 8.5 (GLOVE) ×1 IMPLANT
GLOVE SUPERSENSE BIOGEL SZ 8.5 (GLOVE) ×2
GOWN STRL REUS W/ TWL LRG LVL3 (GOWN DISPOSABLE) ×1 IMPLANT
GOWN STRL REUS W/TWL 2XL LVL3 (GOWN DISPOSABLE) ×6 IMPLANT
GOWN STRL REUS W/TWL LRG LVL3 (GOWN DISPOSABLE) ×3
GRAFT BNE MATRIX VG FRMBL SM 1 (Bone Implant) IMPLANT
KIT BASIN OR (CUSTOM PROCEDURE TRAY) ×3 IMPLANT
KIT ROOM TURNOVER OR (KITS) ×3 IMPLANT
NDL SPNL 18GX3.5 QUINCKE PK (NEEDLE) ×1 IMPLANT
NEEDLE SPNL 18GX3.5 QUINCKE PK (NEEDLE) ×9 IMPLANT
NS IRRIG 1000ML POUR BTL (IV SOLUTION) ×3 IMPLANT
PACK ORTHO CERVICAL (CUSTOM PROCEDURE TRAY) ×3 IMPLANT
PACK UNIVERSAL I (CUSTOM PROCEDURE TRAY) ×3 IMPLANT
PAD ARMBOARD 7.5X6 YLW CONV (MISCELLANEOUS) ×6 IMPLANT
PATTIES SURGICAL .25X.25 (GAUZE/BANDAGES/DRESSINGS) IMPLANT
PIN DISTRACTION 14 (PIN) ×4 IMPLANT
PLATE SKYLINE 12MM (Plate) ×2 IMPLANT
RESTRAINT LIMB HOLDER UNIV (RESTRAINTS) ×3 IMPLANT
SCREW SKYLINE 14MM SD-VA (Screw) ×8 IMPLANT
SPONGE INTESTINAL PEANUT (DISPOSABLE) ×3 IMPLANT
SPONGE SURGIFOAM ABS GEL 100 (HEMOSTASIS) ×3 IMPLANT
SURGIFLO W/THROMBIN 8M KIT (HEMOSTASIS) IMPLANT
SUT BONE WAX W31G (SUTURE) ×3 IMPLANT
SUT MON AB 3-0 SH 27 (SUTURE) ×3
SUT MON AB 3-0 SH27 (SUTURE) ×1 IMPLANT
SUT SILK 2 0 (SUTURE) ×3
SUT SILK 2-0 18XBRD TIE 12 (SUTURE) IMPLANT
SUT VIC AB 2-0 CT1 18 (SUTURE) ×3 IMPLANT
SYR BULB IRRIGATION 50ML (SYRINGE) ×5 IMPLANT
SYR CONTROL 10ML LL (SYRINGE) ×7 IMPLANT
TAPE CLOTH 4X10 WHT NS (GAUZE/BANDAGES/DRESSINGS) ×3 IMPLANT
TAPE UMBILICAL COTTON 1/8X30 (MISCELLANEOUS) ×3 IMPLANT
TOWEL OR 17X24 6PK STRL BLUE (TOWEL DISPOSABLE) ×3 IMPLANT
TOWEL OR 17X26 10 PK STRL BLUE (TOWEL DISPOSABLE) ×3 IMPLANT
WATER STERILE IRR 1000ML POUR (IV SOLUTION) ×3 IMPLANT
YANKAUER SUCT BULB TIP NO VENT (SUCTIONS) ×2 IMPLANT

## 2016-09-10 NOTE — Discharge Instructions (Signed)

## 2016-09-10 NOTE — Transfer of Care (Signed)
Immediate Anesthesia Transfer of Care Note  Patient: Edward Barrett  Procedure(s) Performed: Procedure(s) with comments: ACDF C5-6 (N/A) - 2.5 hrs  Patient Location: PACU  Anesthesia Type:General  Level of Consciousness: awake, alert  and oriented  Airway & Oxygen Therapy: Patient Spontanous Breathing and Patient connected to nasal cannula oxygen  Post-op Assessment: Report given to RN, Post -op Vital signs reviewed and stable and Patient moving all extremities X 4  Post vital signs: Reviewed and stable  Last Vitals:  Vitals:   09/10/16 0947  BP: 130/82  Pulse: (!) 49  Resp: 20  Temp: 36.8 C    Last Pain:  Vitals:   09/10/16 1021  TempSrc:   PainSc: 4          Complications: No apparent anesthesia complications

## 2016-09-10 NOTE — Op Note (Signed)
Operative note.  Preoperative diagnosis. C5-6 degenerative cervical disc disease with left radicular arm pain.  Postoperative diagnosis. Same.  Operative procedure. Anterior cervical discectomy and fusion (ACDF) C5-6.  First assistant [facility].  Implants used. Titan nano lock size 8 medium intervertebral cage. [place]w skyline anterior cervical plate. 12 mm in length. Affixed with 14 mm self drilling screws. Allograft used. vivogen  Indications this is a very pleasant 51 year old gentleman who had progressive neck pain and neuropathic left arm pain. Attempts at conservative management fail to alleviate his symptoms and so we elected to proceed with surgery. All appropriate risks benefits and alternatives to surgery were discussed with the patient. Consent was obtained.  Operative note.  Patient was brought to the operating room placed on the operating table. After successful induction of general anesthesia and endotracheal intubation teds SCDs were applied. A gel roll was placed underneath the shoulder blades to re-create the cervical lordosis. Her arms were tucked at the side and the anterior cervical spine was prepped and draped in a standard fashion. Timeout was taken to confirm patient, procedure, and all other important data.  X-ray was used to fluoroscopy used to identify the C5-6 level. The incision site was marked and infiltrated with quarter percent Marcaine with epi. Then transverse incision was made and sharp dissection was carried out down to the platysma. The platysma sharply incised in a standard Alben Deeds approach the anterior cervical spine was I dissected sharply along the medial border of the sternocleidomastoid identified and released the sling of the omohyoid. I then bluntly dissected through the remaining deep cervical and prevertebral fascia to palpate the anterior cervical spine. Self-retaining retractor was placed to retract and protect the esophagus. I could visualize  the carotid sheath and protected it with the finger. Using Kitner dissectors at mobilized remaining prevertebral fascia to expose the anterior longitudinal ligament from the mid body of C5 to the mid body of C6. Using bipolar cautery I released the longus coli muscle from the mid body of C5 to the mid body C6. Self-retaining retractors were placed underneath the longus coli muscle the endotracheal cuff was deflated and extended retractor to the appropriate width. The endotracheal cuff was reinflated and I took a second x-ray to confirm the C5-6 level.  Once this was confirmed annulotomy was performed with a 15 blade scalpel. Using pituitary rongeurs curettes and Kerrison rongeurs I removed all the disc material. I then fine nerve root 12 plane underneath the posterior longitudinal ligament. I then used a 1 mm Kerrison rongeur to resect the posterior longitudinal ligament. This allowed me to get to also remove the posterior osteophyte from the C5 and C6 vertebral bodies. I was also able to undercut the uncovertebral joint to further decompress the exiting nerve root. At this point I could freely pass my nerve hook underneath the body of C5 and C6. The anterior aspect of the thecal sac was visualized. At this point I was very pleased with the decompression. I rasped the endplates and then trialed and elected to use a size 8 lordotic medium spacer. This was packed with the allograft and malleted to the appropriate depth retracting blades were removed the distraction pinholes were sealed with bone wax and anterior cervical plate was applied and affixed with self drilling screws. All 4 screws had excellent purchase. Final locking mechanism was torqued according manufacturer's standards. The wound was then copious irrigated with normal saline hemostasis was confirmed using bipolar cautery. The trachea and esophagus or return to midline  and I closed the platysma with interrupted 2-0 Vicryl suture. Skin was then  reapproximated with 3-0 Monocryl. Steri-Strips dry dressing were applied and the patient was extubated transferred the PACU that incident.  At the end the case all needle sponge counts were correct. There are no adverse intraoperative events.

## 2016-09-10 NOTE — Brief Op Note (Signed)
09/10/2016  2:11 PM  PATIENT:  Ellison Hughs  51 y.o. 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

## 2016-09-10 NOTE — Anesthesia Procedure Notes (Signed)
Procedure Name: Intubation Performed by: Mariea Clonts Patient Re-evaluated:Patient Re-evaluated prior to inductionOxygen Delivery Method: Circle system utilized Preoxygenation: Pre-oxygenation with 100% oxygen Intubation Type: IV induction Ventilation: Oral airway inserted - appropriate to patient size and Mask ventilation with difficulty Laryngoscope Size: Mac and 4 Grade View: Grade I Tube type: Oral Tube size: 7.5 mm Number of attempts: 1 Airway Equipment and Method: Stylet Placement Confirmation: ETT inserted through vocal cords under direct vision,  positive ETCO2,  CO2 detector and breath sounds checked- equal and bilateral Secured at: 23 cm Tube secured with: Tape Dental Injury: Teeth and Oropharynx as per pre-operative assessment  Comments: Neutral position

## 2016-09-10 NOTE — Anesthesia Preprocedure Evaluation (Signed)
Anesthesia Evaluation  Patient identified by MRN, date of birth, ID band Patient awake    Reviewed: Allergy & Precautions, NPO status , Patient's Chart, lab work & pertinent test results  History of Anesthesia Complications Negative for: history of anesthetic complications  Airway Mallampati: II  TM Distance: >3 FB Neck ROM: Full    Dental  (+) Edentulous Lower, Edentulous Upper   Pulmonary former smoker,    breath sounds clear to auscultation       Cardiovascular negative cardio ROS   Rhythm:Regular Rate:Normal     Neuro/Psych    GI/Hepatic negative GI ROS, Neg liver ROS,   Endo/Other  negative endocrine ROS  Renal/GU negative Renal ROS     Musculoskeletal   Abdominal   Peds  Hematology   Anesthesia Other Findings   Reproductive/Obstetrics                             Anesthesia Physical Anesthesia Plan  ASA: II  Anesthesia Plan: General   Post-op Pain Management:    Induction:   PONV Risk Score and Plan: 2 and Ondansetron and Dexamethasone  Airway Management Planned: Oral ETT  Additional Equipment:   Intra-op Plan:   Post-operative Plan:   Informed Consent: I have reviewed the patients History and Physical, chart, labs and discussed the procedure including the risks, benefits and alternatives for the proposed anesthesia with the patient or authorized representative who has indicated his/her understanding and acceptance.     Plan Discussed with: CRNA  Anesthesia Plan Comments:         Anesthesia Quick Evaluation

## 2016-09-10 NOTE — H&P (Signed)
History of Present Illness  The patient is a 51 year old male who comes in today for a preoperative History and Physical. The patient is scheduled for a ACDF C5-6 to be performed by Dr. Duane Lope D. Rolena Infante, MD at [facility] on 09/10/16 . Please see the hospital record for complete dictated history and physical. Pt reports hx of good health except for chronic pain from his LB and neck.   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 (" I take 3 a day for lower back pain Rx'd by PCP" "I have been taking since '96") Medications Reconciled  Vitals  09/04/2016 7:58 AM Weight: 180 lb Height: 72.5in Body Surface Area: 2.05 m Body Mass Index: 24.08 kg/m  Temp.: 22F  Pulse: 51 (Regular)  BP: 143/80 (Sitting, Left Arm, Standard)  General General Appearance-Not in acute distress. Orientation-Oriented X3. Build & Nutrition-Well nourished and Well developed.  Integumentary General Characteristics Surgical Scars -  no surgical scar evidence of previous cervical surgery. Cervical Spine-Skin examination of the cervical spine is without deformity, skin lesions, lacerations or abrasions.  Chest and Lung Exam Auscultation Breath sounds - Normal and Clear.  Cardiovascular Auscultation Rhythm - Regular rate and rhythm.  Peripheral Vascular Upper Extremity Palpation - Radial pulse - Bilateral - 2+.  Neurologic Sensation Upper Extremity - Bilateral - sensation is intact in the upper extremity. Reflexes Biceps Reflex - Bilateral - 2+. Brachioradialis Reflex - Bilateral - 2+. Triceps Reflex - Bilateral - 2+. Hoffman's Sign - Bilateral - Hoffman's sign not present.  Musculoskeletal Spine/Ribs/Pelvis  Cervical Spine : Inspection and Palpation - Tenderness - right cervical paraspinals tender to palpation, left cervical paraspinals tender to palpation, right trapezius tender to palpation and left trapezius tender to palpation. Strength and Tone: Strength - Deltoid - Bilateral - 5/5. Biceps - Bilateral - 5/5. Triceps - Bilateral - 5/5. Wrist Extension - Bilateral - 5/5. Hand Grip - Bilateral - 5/5. Heel walk - Bilateral - able to heel walk without difficulty. Toe Walk - Bilateral - able to walk on toes without difficulty. Heel-Toe Walk - Bilateral - able to heel-toe walk without difficulty. Cervical Spine - ROM - Note: positive spurlings on the right. ROM - Flexion - . Note: positive spurlings on the right. Moderately Decreased and painful. Extension - Moderately Decreased and painful. Left Lateral Flexion - Moderately Decreased and painful. Right Lateral Flexion - Moderately Decreased and painful. Left Rotation - Moderately Decreased and painful. Right Rotation - Moderately Decreased and painful. Pain - neither flexion or extension is more painful than the other. Cervical Spine - Special Testing - axial compression test negative,  cross chest impingement test negative. Non-Anatomic Signs - No non-anatomic signs  present. Upper Extremity Range of Motion - No truesholder pain with IR/ER of the shoulders.  His MRI from May of 2018 shows severe left and severe right foraminal narrowing at C5-6, slight retrolisthesis of C5 on C6 causing some spinal stenosis. No cord signal change was noted. Mild degeneration at the C7 level, only minimal foraminal narrowing 4-5 similar minimal foraminal narrowing. He has asymmetric to the left disc protrusion at C3-4, with mild narrowing at this point.   Assessment & Plan   Goal Of Surgery: Discussed that goal of surgery is to reduce pain and improve function and quality of life. Patient is aware that despite all appropriate treatment that there pain and function could be the same, worse, or different.  Anterior cervical fusion:Risks of surgery include, but are not limited to: Throat pain, swallowing difficulty, hoarseness or change in voice, death, stroke, paralysis, nerve root damage/injury, bleeding, blood clots, loss of bowel/bladder control, hardware failure, or mal-position, spinal fluid leak, adjacent segment disease, non-union, need for further surgery, ongoing or worse pain, infection. Post-operative bleeding or swelling that could require emergent surgery.  He returns today for followup. Clinically, he continues to have significant neck pain, positive Spurling's sign with bilateral arm pain with the right side being still the most significant. He has no focal motor deficits, but he does have dysesthesias primarily in that C6 dermatome and occasionally even into the C7. He has a negative Hoffmann sign. Normal gait pattern. Positive Spurling's sign in the right C6 dermatome. He has no shortness of breath or chest pain.  His repeat MRI from 08/01/2016, shows severe left and severe right foraminal narrowing at C5-6, slight retrolisthesis of C5 on C6 causing some spinal stenosis, no cord signal change. Mild degeneration at the 6-7 level, only minimal foraminal narrowing,  4-5 similar minimal foraminal narrowing. He has an asymmetric to the left disk protrusion at C3-4 with mild foraminal narrowing. At this point in time, I think the principal source of his pain is that C5-6 foraminal stenosis with nerve compression. Clinically, he got about 2 days of relief with the last 2 levels selective nerve root block and I do believe it was actually the block to C6 that provided the greatest degree of relief. His nerve conduction test demonstrated no peripheral neuropathy.  At this point in time, given the failure of injection, therapy and poor quality of life and the duration of the symptoms and the failure of physical therapy, we move forward with a single level ACDF. We have gone over the risks, benefits of surgery and the goals. I did tell him to look there are other areas of his neck that showed degenerative changes and in the future, they could breakdown, but at this point, his most symptomatic level I believe is the 5-6 level and I would recommend only addressing that. All of his and his wife's questions were addressed.

## 2016-09-10 NOTE — Evaluation (Signed)
Physical Therapy Evaluation and Discharge Patient Details Name: Edward Barrett MRN: [id number] DOB: [birth year] Today's Date: 09/10/2016   History of Present Illness  Pt is 51 y/o male s/p C5-6 ACDF. 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   Clinical Impression  Patient evaluated by Physical Therapy with no further acute PT needs identified. All education has been completed and the patient has no further questions. Pt overall steady during ambulation. Presenting with LLE deficits present at baseline during ambulation. Educated about being aware of step height and taking his time during mobility. Pt will have necessary assist at home. Cervical precautions reviewed and stair training completed. No further needs at this time. See below for any follow-up Physical Therapy or equipment needs. PT is signing off. Thank you for this referral. If needs change, please re-consult.      Follow Up Recommendations No PT follow up;Supervision for mobility/OOB    Equipment Recommendations  None recommended by PT    Recommendations for Other Services       Precautions / Restrictions Precautions Precautions: Cervical Precaution Comments: Reviewed cervical precaution handout with pt.  Required Braces or Orthoses: Cervical Brace Cervical Brace: Hard collar Restrictions Weight Bearing Restrictions: No      Mobility  Bed Mobility Overal bed mobility: Needs Assistance Bed Mobility: Rolling;Sidelying to Sit;Sit to Sidelying Rolling: Supervision Sidelying to sit: Supervision     Sit to sidelying: Supervision General bed mobility comments: Supervision for safety. Education about use of log roll technique at baseline.   Transfers Overall transfer level: Needs assistance Equipment used: None Transfers: Sit to/from Stand Sit to Stand: Supervision         General transfer comment: Supervision for safety.  Ambulation/Gait Ambulation/Gait  assistance: Supervision Ambulation Distance (Feet): 300 Feet Assistive device: None Gait Pattern/deviations: Step-through pattern;Decreased step length - left;Shuffle Gait velocity: WNL Gait velocity interpretation: at or above normal speed for age/gender General Gait Details: Steady gait over all. Occaisional L toe drag secondary to previous reported paralysis, slight LOB but able to self correct. Educated to be aware of LLE step height, and to take his time during gait. Pt reports history of falls, educated to take his time and be aware of LLE deficits.   Stairs Stairs: Yes Stairs assistance: Min guard;Supervision Stair Management: One rail Right;Alternating pattern;Step to pattern;Forwards Number of Stairs: 5 General stair comments: Alternating pattern used for ascending steps and step to pattern used for descending steps. Educated to have someone with him during stair navigation to increase safety.   Wheelchair Mobility    Modified Rankin (Stroke Patients Only)       Balance Overall balance assessment: Needs assistance Sitting-balance support: No upper extremity supported;Feet supported Sitting balance-Leahy Scale: Good     Standing balance support: No upper extremity supported;During functional activity Standing balance-Leahy Scale: Fair                               Pertinent Vitals/Pain Pain Assessment: 0-10 Pain Score: 5  Pain Location: neck  Pain Descriptors / Indicators: Operative site guarding;Sore Pain Intervention(s): Limited activity within patient's tolerance;Monitored during session;Repositioned    Home Living Family/patient expects to be discharged to:: Private residence Living Arrangements: Spouse/significant other Available Help at Discharge: Family;Available 24 hours/day Type of Home: House Home Access: Stairs to enter Entrance Stairs-Rails: Right;Left;Can reach both Entrance Stairs-Number of Steps: 5 Home Layout: One level Home  Equipment: None  Prior Function Level of Independence: Independent               Hand Dominance        Extremity/Trunk Assessment   Upper Extremity Assessment Upper Extremity Assessment: Defer to OT evaluation    Lower Extremity Assessment Lower Extremity Assessment: LLE deficits/detail LLE Deficits / Details: LLE weakness at baseline; otherwise strength WFL    Cervical / Trunk Assessment Cervical / Trunk Assessment: Other exceptions Cervical / Trunk Exceptions: s/p ACDF  Communication   Communication: No difficulties  Cognition Arousal/Alertness: Awake/alert Behavior During Therapy: WFL for tasks assessed/performed Overall Cognitive Status: Within Functional Limits for tasks assessed                                        General Comments General comments (skin integrity, edema, etc.): Educated about generalized walking program. Pt's wife and children in the room.     Exercises     Assessment/Plan    PT Assessment Patent does not need any further PT services  PT Problem List         PT Treatment Interventions      PT Goals (Current goals can be found in the Care Plan section)  Acute Rehab PT Goals Patient Stated Goal: to go home  PT Goal Formulation: With patient Time For Goal Achievement: 09/10/16 Potential to Achieve Goals: Good    Frequency     Barriers to discharge        Co-evaluation               AM-PAC PT "6 Clicks" Daily Activity  Outcome Measure Difficulty turning over in bed (including adjusting bedclothes, sheets and blankets)?: None Difficulty moving from lying on back to sitting on the side of the bed? : None Difficulty sitting down on and standing up from a chair with arms (e.g., wheelchair, bedside commode, etc,.)?: None Help needed moving to and from a bed to chair (including a wheelchair)?: None Help needed walking in hospital room?: None Help needed climbing 3-5 steps with a railing? : A Little 6  Click Score: 23    End of Session Equipment Utilized During Treatment: Gait belt;Cervical collar Activity Tolerance: Patient tolerated treatment well Patient left: in bed;with call bell/phone within reach;with family/visitor present Nurse Communication: Mobility status PT Visit Diagnosis: Unsteadiness on feet (R26.81)    Time: 2585-2778 PT Time Calculation (min) (ACUTE ONLY): 16 min   Charges:   PT Evaluation $PT Eval Low Complexity: 1 Procedure     PT G Codes:   PT G-Codes **NOT FOR INPATIENT CLASS** Functional Assessment Tool Used: AM-PAC 6 Clicks Basic Mobility;Clinical judgement Functional Limitation: Mobility: Walking and moving around Mobility: Walking and Moving Around Current Status (E4235): At least 1 percent but less than 20 percent impaired, limited or restricted Mobility: Walking and Moving Around Goal Status [phone]): At least 1 percent but less than 20 percent impaired, limited or restricted Mobility: Walking and Moving Around Discharge Status [phone]): At least 1 percent but less than 20 percent impaired, limited or restricted    Leighton Ruff, PT, DPT  Acute Rehabilitation Services  Pager: [place] 09/10/2016, 5:20 PM

## 2016-09-11 ENCOUNTER — Encounter (HOSPITAL_COMMUNITY): Payer: Self-pay | Admitting: Orthopedic Surgery

## 2016-09-11 DIAGNOSIS — M4802 Spinal stenosis, cervical region: Secondary | ICD-10-CM | POA: Diagnosis not present

## 2016-09-11 NOTE — Progress Notes (Signed)
    Subjective: Procedure(s) (LRB): ACDF C5-6 (N/A) 1 Day Post-Op  Patient reports pain as 1 on 0-10 scale.  Reports decreased arm pain reports incisional neck pain   Positive void Negative bowel movement Positive flatus Negative chest pain or shortness of breath  Objective: Vital signs in last 24 hours: Temp:  [97.3 F (36.3 C)-98.6 F (37 C)] 98.2 F (36.8 C) (06/29 0400) Pulse Rate:  [49-62] 55 (06/29 0400) Resp:  [8-24] 18 (06/29 0400) BP: (117-140)/(70-92) 117/73 (06/29 0400) SpO2:  [96 %-100 %] 99 % (06/29 0400) Weight:  [83.5 kg (184 lb)] 83.5 kg (184 lb) (06/28 0947)  Intake/Output from previous day: 06/28 0701 - 06/29 0700 In: 1500 [I.V.:1300; IV Piggyback:200] Out: 530 [Urine:500; Blood:30]  Labs: No results for input(s): WBC, RBC, HCT, PLT in the last 72 hours. No results for input(s): NA, K, CL, CO2, BUN, CREATININE, GLUCOSE, CALCIUM in the last 72 hours. No results for input(s): LABPT, INR in the last 72 hours.  Physical Exam: Neurologically intact Sensation intact distally Intact pulses distally Incision: dressing C/D/I and no drainage Compartment soft  Assessment/Plan: Patient stable  xrays satisfactory Mobilization with physical therapy Encourage incentive spirometry Continue care  Advance diet Up with therapy  Doing well No dysphagia or dysphonia No swelling noted at incision site Plan on d/c to home - f/u 2 weeks  Melina Schools, MD [place] [phone]

## 2016-09-11 NOTE — Progress Notes (Signed)
Pt and wife given D/C instructions with Rx's, verbal understanding was provided. Pt's incision is covered with Honeycomb dressing and is clean and dry. Pt has Aspen collar on @ D/C. Pt's IV was removed prior D/C. Pt D/C'd home via walking @ 1030 per MD order. Pt is stable @ D/C and has no other needs at this time. Holli Humbles, RN

## 2016-09-11 NOTE — Anesthesia Postprocedure Evaluation (Signed)
Anesthesia Post Note  Patient: MARCUS SCHWANDT  Procedure(s) Performed: Procedure(s) (LRB): ACDF C5-6 (N/A)     Patient location during evaluation: PACU Anesthesia Type: General Level of consciousness: awake and alert Pain management: pain level controlled Vital Signs Assessment: post-procedure vital signs reviewed and stable Respiratory status: spontaneous breathing, nonlabored ventilation, respiratory function stable and patient connected to nasal cannula oxygen Cardiovascular status: blood pressure returned to baseline and stable Postop Assessment: no signs of nausea or vomiting Anesthetic complications: no    Last Vitals:  Vitals:   09/11/16 0400 09/11/16 0822  BP: 117/73 125/73  Pulse: (!) 55 (!) 52  Resp: 18 18  Temp: 36.8 C 36.8 C    Last Pain:  Vitals:   09/11/16 0822  TempSrc: Oral  PainSc:                  Effie Berkshire

## 2016-09-11 NOTE — Evaluation (Signed)
Occupational Therapy Evaluation and Discharge Patient Details Name: Edward Barrett Today's Date: 09/11/2016    History of Present Illness Pt is 51 y/o male s/p C5-6 ACDF. 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    Clinical Impression   This 51 yo male admitted and underwent above presents to acute OT with all education completed with pt and significant other, no further OT needs, we will sign off.    Follow Up Recommendations  No OT follow up;Supervision - Intermittent    Equipment Recommendations  None recommended by OT       Precautions / Restrictions Precautions Precautions: Cervical Precaution Comments: Reviewed cervical precaution handout with pt.  Required Braces or Orthoses: Cervical Brace Cervical Brace: Hard collar;At all times (pt did report that he was told in a couple of weeks he can stop wearing it to sleep in ) Restrictions Weight Bearing Restrictions: No      Mobility Bed Mobility Overal bed mobility: Modified Independent             General bed mobility comments: pt intends to sleep in recliner once home  Transfers Overall transfer level: Needs assistance Equipment used: None Transfers: Sit to/from Stand Sit to Stand: Supervision                  ADL either performed or assessed with clinical judgement   ADL                                         General ADL Comments: pt is able to cross his legs to get to his feet. I went over wear and care of c-collar with pt and significant other. I instructed them in how to donn-doff collar and change position of chin rise as well as how to change out the pads (if they are able to get  more from MD office) and if not how to keep them clean and dry with eating/brushing teeth. Educated to use 2 cups for brushing teeth (one for rinse with straw and one to spit)--thus avoiding bending over the sink       Vision Patient Visual Report: No change from baseline              Pertinent Vitals/Pain Pain Assessment: No/denies pain     Hand Dominance Right   Extremity/Trunk Assessment Upper Extremity Assessment Upper Extremity Assessment: LUE deficits/detail;RUE deficits/detail RUE Deficits / Details: reports it better since surgery LUE Deficits / Details: reports it still is somewhat painful and numb           Communication Communication Communication: No difficulties   Cognition Arousal/Alertness: Awake/alert Behavior During Therapy: WFL for tasks assessed/performed Overall Cognitive Status: Within Functional Limits for tasks assessed                                                Home Living Family/patient expects to be discharged to:: Private residence Living Arrangements: Spouse/significant other Available Help at Discharge: Family;Available 24 hours/day Type of Home: House Home Access: Stairs to enter [employer] of Steps: 5 Entrance Stairs-Rails: Right;Left;Can reach both Home Layout: One level     Bathroom Shower/Tub: Teacher, early years/pre: Standard  Home Equipment: None          Prior Functioning/Environment Level of Independence: Independent                 OT Problem List: Decreased strength;Pain         OT Goals(Current goals can be found in the care plan section) Acute Rehab OT Goals Patient Stated Goal: home today  OT Frequency:                AM-PAC PT "6 Clicks" Daily Activity     Outcome Measure Help from another person eating meals?: None Help from another person taking care of personal grooming?: A Little Help from another person toileting, which includes using toliet, bedpan, or urinal?: None Help from another person bathing (including washing, rinsing, drying)?: None Help from another person to put on and taking off regular upper body clothing?: None Help from another  person to put on and taking off regular lower body clothing?: None 6 Click Score: 23   End of Session    Activity Tolerance: Patient tolerated treatment well Patient left: in bed;with call bell/phone within reach;with family/visitor present  OT Visit Diagnosis: Pain Pain - Right/Left: Left Pain - part of body: Shoulder                Time: 4540-9811 OT Time Calculation (min): 15 min Charges:  OT General Charges $OT Visit: 1 Procedure OT Evaluation $OT Eval Low Complexity: 1 Procedure G-Codes: OT G-codes **NOT FOR INPATIENT CLASS** Functional Limitation: Self care Self Care Current Status (B1478): At least 1 percent but less than 20 percent impaired, limited or restricted Self Care Goal Status (G9562): At least 1 percent but less than 20 percent impaired, limited or restricted Self Care Discharge Status [phone]): At least 1 percent but less than 20 percent impaired, limited or restricted   [address], OTR/L [phone] 09/11/2016

## 2016-09-17 ENCOUNTER — Encounter (HOSPITAL_COMMUNITY): Payer: Self-pay | Admitting: Orthopedic Surgery

## 2016-10-04 NOTE — Discharge Summary (Signed)
Physician Discharge Summary  Patient ID: Edward Barrett MRN: [id number] DOB/AGE: 1965/11/13 51 y.o.  Admit date: 09/10/2016 Discharge date: 09/11/16  Admission Diagnoses:  Cervical DDD  Discharge Diagnoses:  Active Problems:   Status post cervical spinal fusion   Neck pain   Past Medical History:  Diagnosis Date  . Arthritis     Surgeries: Procedure(s): ACDF C5-6 on 09/10/2016   Consultants (if any):   Discharged Condition: Improved  Hospital Course: Edward Barrett is an 51 y.o. male who was admitted 09/10/2016 with a diagnosis of Cervical DDDD and went to the operating room on 09/10/2016 and underwent the above named procedures.  Post op day one pt reports low pain controlled with oral medication.  Pt reports voiding w/o difficulty.  Pt is ambulating in hallway.  Pt is cleared by PT for DC.  He was given perioperative antibiotics:  Anti-infectives    Start     Dose/Rate Route Frequency Ordered Stop   09/10/16 2000  ceFAZolin (ANCEF) IVPB 2g/100 mL premix     2 g 200 mL/hr over 30 Minutes Intravenous Every 8 hours 09/10/16 1639 09/11/16 0500   09/10/16 0934  ceFAZolin (ANCEF) 2-4 GM/100ML-% IVPB    Comments:  Forte, Lindsi   : cabinet override      09/10/16 0934 09/10/16 1232   09/10/16 0932  ceFAZolin (ANCEF) IVPB 2g/100 mL premix     2 g 200 mL/hr over 30 Minutes Intravenous 30 min pre-op 09/10/16 0932 09/10/16 1232    .  He was given sequential compression devices, early ambulation, and TED for DVT prophylaxis.  He benefited maximally from the hospital stay and there were no complications.    Recent vital signs:  Vitals:   09/11/16 0400 09/11/16 0822  BP: 117/73 125/73  Pulse: (!) 55 (!) 52  Resp: 18 18  Temp: 98.2 F (36.8 C) 98.2 F (36.8 C)    Recent laboratory studies:  Lab Results  Component Value Date   HGB 14.9 09/02/2016   Lab Results  Component Value Date   WBC 6.0 09/02/2016   PLT 183 09/02/2016   No results found for: INR Lab Results   Component Value Date   NA 137 09/02/2016   K 4.0 09/02/2016   CL 103 09/02/2016   CO2 28 09/02/2016   BUN 15 09/02/2016   CREATININE 0.93 09/02/2016   GLUCOSE 111 (H) 09/02/2016    Discharge Medications:   Allergies as of 09/11/2016      Reactions   No Known Allergies       Medication List    STOP taking these medications   oxyCODONE-acetaminophen 5-325 MG tablet Commonly known as:  PERCOCET/ROXICET Replaced by:  oxyCODONE-acetaminophen 10-325 MG tablet     TAKE these medications   methocarbamol 500 MG tablet Commonly known as:  ROBAXIN Take 1 tablet (500 mg total) by mouth 3 (three) times daily as needed for muscle spasms.   ondansetron 4 MG tablet Commonly known as:  ZOFRAN Take 1 tablet (4 mg total) by mouth every 8 (eight) hours as needed for nausea or vomiting.   oxyCODONE-acetaminophen 10-325 MG tablet Commonly known as:  PERCOCET Take 1 tablet by mouth every 4 (four) hours as needed for pain. Replaces:  oxyCODONE-acetaminophen 5-325 MG tablet       Diagnostic Studies: Dg Cervical Spine 2 Or 3 Views  Result Date: 09/10/2016 CLINICAL DATA:  Status post C5-6 ACDF. EXAM: CERVICAL SPINE - 2-3 VIEW COMPARISON:  Intraoperative fluoro spot views of today's  date FINDINGS: The fusion plate and cortical screws at C5-6 appear appropriately position. The intradiscal device also appears normal in position. There is mild prevertebral soft tissue swelling. The other cervical levels exhibit no acute abnormalities. IMPRESSION: No immediate postprocedure complication following ACDF at C5-6. Electronically Signed   By: David  Martinique M.D.   On: 09/10/2016 15:25   Dg Cervical Spine 2-3 Views  Result Date: 09/10/2016 CLINICAL DATA:  Anterior fusion at C5-6 EXAM: CERVICAL SPINE - 2-3 VIEW COMPARISON:  None. FINDINGS: Two C-arm spot films show anterior fusion at C5-6. The anterior metallic fixation plate is in good position as is the interbody fusion device. No complicating features are  seen. IMPRESSION: Anterior fusion at C5-6. Electronically Signed   By: Ivar Drape M.D.   On: 09/10/2016 14:13   Dg C-arm 1-60 Min  Result Date: 09/10/2016 CLINICAL DATA:  Cervical spine surgery. FLUOROSCOPY TIME:  16 seconds Images: 2 EXAM: DG C-ARM 61-120 MIN COMPARISON:  None. FINDINGS: An anterior plate is seen at J1-9, affixed with 2 screws. A disc spacer device is seen at the same level. IMPRESSION: Surgical changes as above. Electronically Signed   By: Dorise Bullion III M.D   On: 09/10/2016 14:12    Disposition: 01-Home or Self Care Pt  Will present to clinic in 2 weeks Post op medications  Discharge Instructions    Incentive spirometry RT    Complete by:  As directed       Follow-up Information    Melina Schools, MD. Schedule an appointment as soon as possible for a visit in 2 weeks.   Specialty:  Orthopedic Surgery Why:  If symptoms worsen, For suture removal Contact information: [address] [place] [address] [phone]            Signed: Valinda Hoar 10/04/2016, 7:45 PM

## 2016-11-02 ENCOUNTER — Ambulatory Visit (HOSPITAL_COMMUNITY): Payer: BLUE CROSS/BLUE SHIELD | Attending: Physician Assistant | Admitting: Physical Therapy

## 2016-11-02 DIAGNOSIS — R29898 Other symptoms and signs involving the musculoskeletal system: Secondary | ICD-10-CM | POA: Diagnosis present

## 2016-11-02 DIAGNOSIS — M25511 Pain in right shoulder: Secondary | ICD-10-CM | POA: Insufficient documentation

## 2016-11-02 DIAGNOSIS — M542 Cervicalgia: Secondary | ICD-10-CM | POA: Diagnosis not present

## 2016-11-02 NOTE — Therapy (Signed)
[place], [place], [address] Phone: [phone]   Fax:  [phone]  Physical Therapy Evaluation  Patient Details  Name: Edward Barrett MRN: [id number] Date of Birth: [birth year] Referring Provider: Cynda Acres   Encounter Date: 11/02/2016      PT End of Session - 11/02/16 0854    Visit Number 1   Number of Visits 8   Date for PT Re-Evaluation 12/02/16   Authorization Type BCBS   Authorization - Visit Number 1   Authorization - Number of Visits 8   PT Start Time 0820   PT Stop Time 1761   PT Time Calculation (min) 35 min   Activity Tolerance Patient tolerated treatment well   Behavior During Therapy [facility] for tasks assessed/performed      Past Medical History:  Diagnosis Date  . Arthritis     Past Surgical History:  Procedure Laterality Date  . ANTERIOR CERVICAL DECOMP/DISCECTOMY FUSION N/A 09/10/2016   Procedure: ACDF C5-6;  Surgeon: Melina Schools, MD;  Location: [place];  Service: Orthopedics;  Laterality: N/A;  2.5 hrs  . BACK SURGERY    . FOOT SURGERY Right    blood poisoning  . HAND EXPLORATION Right    tendons etc cut  . I&D EXTREMITY Left    hand  . SHOULDER ARTHROSCOPY Left     There were no vitals filed for this visit.       Subjective Assessment - 11/02/16 0825    Subjective Edward Barrett had a cervical fusion on 09/10/2016 and is currently being referred for physical therapy.  He has been pushing, pulling and playing baseball with his grandson.  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     Pertinent History Pt had surgery on his LT shoulder, back, foot.    How long can you sit comfortably? due to lower back is unable to sit for 30 minutes    How long can you stand comfortably? able to stand 30 minutes    How long can you walk comfortably? able to walk as long as he wants    Patient Stated Goals Pt wants to be able to be able to get back normal activity.   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 Yes   Pain Score 2    Pain Location Neck   Pain Orientation Posterior   Pain Descriptors / Indicators Tightness   Pain Type Chronic pain   Pain Radiating Towards Lt shoulder shoulder pain 3/10 goes up as high as a 6/10    Pain Onset More than a month ago   Pain Frequency Constant   Aggravating Factors  turning quickly.   Pain Relieving Factors medication ( pt has been on oxycodone since 1995)   Effect of Pain on Daily Activities increases             [facility] PT Assessment - 11/02/16 0001      Assessment   Medical Diagnosis Cervical fusion    Referring Provider Cynda Acres    Onset Date/Surgical Date 09/10/16   Hand Dominance Right   Next MD Visit 12/04/2016   Prior Therapy pre surgery had occupational therapy     Precautions   Precautions None     Restrictions   Weight Bearing Restrictions No     Prior Function   Level of Independence Independent   Vocation Full time employment  Contractor    Leisure play with grandchild      Cognition   Overall Cognitive Status Within Functional Limits for tasks assessed     Observation/Other Assessments   Focus on Therapeutic Outcomes (FOTO)  47     ROM / Strength   AROM / PROM / Strength AROM;Strength     AROM   AROM Assessment Site Cervical   Right/Left Shoulder --  functional    Cervical Flexion 40   Cervical Extension 48   Cervical - Right Side Bend 35   Cervical - Left Side Bend 32   Cervical - Right Rotation 57   Cervical - Left Rotation 55     Strength   Strength Assessment Site Shoulder;Hand;Cervical   Right/Left Shoulder --  Shoulder Strength wnl Bilaterally    Right/Left hand Right;Left   Right Hand Grip (lbs) 130   Left Hand Grip (lbs) 120   Cervical Extension 5/5   Cervical - Right Side Bend 5/5   Cervical - Left Side Bend 5/5     Palpation   Palpation comment no spasms noted              Objective measurements completed on examination: See above findings.          [facility] Adult PT Treatment/Exercise - 11/02/16 0001      Neck Exercises: Standing   Neck Retraction 5 reps   Other Standing Exercises cervical retraction x 5                PT Education - 11/02/16 0854    Education provided Yes   Education Details Posture; HEP   Person(s) Educated Patient   Methods Explanation;Handout   Comprehension Verbalized understanding          PT Short Term Goals - 11/02/16 1306      PT SHORT TERM GOAL #1   Title Pt to be able to verbalize the importance of good posture and body mechanics in spinal health   Time 2   Period Weeks   Status New   Target Date 11/17/16     PT SHORT TERM GOAL #2   Title Pt to be able to lift 10 # from waist height carry x 50 feet and lower to 12 off floor, (work sim)   Time 2   Period Weeks     PT SHORT TERM GOAL #3   Title Pt to no longer have stiffness in his neck    Time 2   Period Weeks   Status New           PT Long Term Goals - 11/02/16 1308      PT LONG TERM GOAL #1   Title Pt to be able to lift 25 # from waist high, carry 20 feet and place 12" from ground    Time 4   Period Weeks   Status New   Target Date 11/30/16     PT LONG TERM GOAL #2   Title Pt cervical rotation to have increased 10 degrees to allow pt to see his blind side with ease while driving    Time 4   Period Weeks   Status New     PT LONG TERM GOAL #3   Title Pt to be able to keep his cervical area stabilized while using both UE to decrease stress on cervical area.   Time 4   Period Weeks   Status New  Plan - 11/02/16 0855    Clinical Impression Statement Edward Barrett is a 51 yo male who has been diagnosed with DDD of his cervical spine and LT shoulder.  He had a cervical fusion of C5-6 on 09/10/2016 and is now being referred to skilled physical therapy.  Evaluation demonstrated decreased cervical  ROM, decreased awareness of posture and increased pain.  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    History and Personal Factors relevant to plan of care: DDD of lumbar, cervical and B shoulder,  Pt has had past surgery on his back, Lt shoulder, hands and foot.    Clinical Presentation Stable   Clinical Decision Making Low   PT Frequency 2x / week   PT Duration 4 weeks   PT Treatment/Interventions ADLs/Self Care Home Management;Patient/family education;Manual techniques;Therapeutic activities;Therapeutic exercise   PT Next Visit Plan Pt is use to completing hard physical labor.  Pt will need education on proper posture, lifting and   that even though he may be able to lift 100 pound lifting 100 pounds by yourself may not be the best thing  to do.  Pt will begin endurance activtity while maintaining cervical stability as well as flexibilty.  Begin cervical and thoracic excursions, wall push ups, wall arches, progress to I,Y,and T's and planks. Lifting from waist , carrying and lowering.  Manual if needed    PT Home Exercise Plan cervical and scapular retraction    Consulted and Agree with Plan of Care Patient      Patient will benefit from skilled therapeutic intervention in order to improve the following deficits and impairments:  Decreased activity tolerance, Decreased range of motion, Decreased safety awareness, Pain  Visit Diagnosis: Cervicalgia - Plan: PT plan of care cert/re-cert  Other symptoms and signs involving the musculoskeletal system - Plan: PT plan of care cert/re-cert     Problem List Patient Active Problem List   Diagnosis Date Noted  . Status post cervical spinal fusion 09/10/2016  . Neck pain 09/10/2016    Rayetta Humphrey, PT CLT [phone] 11/02/2016, 1:16 PM  [facility] [address] [place], [place], [address] Phone: [phone]   Fax:  [phone]  Name: MOZES SAGAR MRN: [id number] Date of Birth: 11-Jan-1966

## 2016-11-02 NOTE — Patient Instructions (Addendum)
Scapular Retraction (Standing)    With arms at sides, pinch shoulder blades together. Repeat __50-10__ times per set. Do __1__ sets per session. Do __4__ sessions per day.  http://orth.exer.us/944   Copyright  VHI. All rights reserved.  Flexibility: Neck Retraction    Pull head straight back, keeping eyes and jaw level. Repeat _5___ times per set. Do ____ sets per session. Do __4__ sessions per day. 1 http://orth.exer.us/344   Copyright  VHI. All rights reserved.

## 2016-11-05 ENCOUNTER — Ambulatory Visit (HOSPITAL_COMMUNITY): Payer: BLUE CROSS/BLUE SHIELD | Admitting: Physical Therapy

## 2016-11-05 DIAGNOSIS — M542 Cervicalgia: Secondary | ICD-10-CM

## 2016-11-05 DIAGNOSIS — M25511 Pain in right shoulder: Secondary | ICD-10-CM

## 2016-11-05 DIAGNOSIS — R29898 Other symptoms and signs involving the musculoskeletal system: Secondary | ICD-10-CM

## 2016-11-05 NOTE — Therapy (Signed)
[place], [place], [address] Phone: [phone]   Fax:  [phone]  Physical Therapy Treatment  Patient Details  Name: Edward Barrett MRN: [id number] Date of Birth: May 30, 1965 Referring Provider: Cynda Acres   Encounter Date: 11/05/2016      PT End of Session - 11/05/16 0842    Visit Number 2   Number of Visits 8   Date for PT Re-Evaluation 12/02/16   Authorization Type BCBS   Authorization - Visit Number 2   Authorization - Number of Visits 8   PT Start Time 0817   PT Stop Time 0855   PT Time Calculation (min) 38 min   Activity Tolerance Patient tolerated treatment well   Behavior During Therapy [facility] for tasks assessed/performed      Past Medical History:  Diagnosis Date  . Arthritis     Past Surgical History:  Procedure Laterality Date  . ANTERIOR CERVICAL DECOMP/DISCECTOMY FUSION N/A 09/10/2016   Procedure: ACDF C5-6;  Surgeon: Melina Schools, MD;  Location: [place];  Service: Orthopedics;  Laterality: N/A;  2.5 hrs  . BACK SURGERY    . FOOT SURGERY Right    blood poisoning  . HAND EXPLORATION Right    tendons etc cut  . I&D EXTREMITY Left    hand  . SHOULDER ARTHROSCOPY Left     There were no vitals filed for this visit.      Subjective Assessment - 11/05/16 0818    Subjective Pt states that he has been doing his exercises and feels good    Pertinent History Pt had surgery on his LT shoulder, back, foot.    How long can you sit comfortably? due to lower back is unable to sit for 30 minutes    How long can you stand comfortably? able to stand 30 minutes    How long can you walk comfortably? able to walk as long as he wants    Patient Stated Goals Pt wants to be able to be able to get back normal activity.  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 No/denies   Pain Onset More than a month ago                         [facility] Adult  PT Treatment/Exercise - 11/05/16 0001      Exercises   Exercises Neck     Neck Exercises: Theraband   Scapula Retraction 10 reps;Green   Shoulder Extension 10 reps;Green   Rows 10 reps;Green     Neck Exercises: Standing   Wall Push Ups 10 reps   Other Standing Exercises w back 3# x 10     Neck Exercises: Seated   Other Seated Exercise cervical and thoracic excursion x 5     Manual Therapy   Manual Therapy Soft tissue mobilization   Manual therapy comments done seperate from all other aspects of treatment   Soft tissue mobilization cervical and upper thoracic B                   PT Short Term Goals - 11/05/16 0858      PT SHORT TERM GOAL #1   Title Pt to be able to verbalize the importance of good posture and body mechanics in spinal health   Time 2   Period Weeks   Status On-going     PT SHORT TERM GOAL #2  Title Pt to be able to lift 10 # from waist height carry x 50 feet and lower to 12 off floor, (work sim)   Time 2   Period Weeks   Status On-going     PT SHORT TERM GOAL #3   Title Pt to no longer have stiffness in his neck    Time 2   Period Weeks   Status On-going           PT Long Term Goals - 11/05/16 1610      PT LONG TERM GOAL #1   Title Pt to be able to lift 25 # from waist high, carry 20 feet and place 12" from ground    Time 4   Period Weeks   Status On-going     PT LONG TERM GOAL #2   Title Pt cervical rotation to have increased 10 degrees to allow pt to see his blind side with ease while driving    Time 4   Period Weeks   Status On-going     PT LONG TERM GOAL #3   Title Pt to be able to keep his cervical area stabilized while using both UE to decrease stress on cervical area.   Time 4   Period Weeks   Status On-going               Plan - 11/05/16 9604    Clinical Impression Statement Pt evaluation and goals reviewed with therapist.  Began both stabilzation and mobility exercises with good form.  Pt has significant  tightness in thoracic paraspinal mm.   PT Frequency 2x / week   PT Duration 4 weeks   PT Treatment/Interventions ADLs/Self Care Home Management;Patient/family education;Manual techniques;Therapeutic activities;Therapeutic exercise   PT Next Visit Plan .  Pt will begin endurance activtity while maintaining cervical stability as well as flexibilty.  Begin wall arches as able with shoulder , progress to I,Y,and T's and planks.Manual if needed    PT Home Exercise Plan cervical and scapular retraction    Consulted and Agree with Plan of Care Patient      Patient will benefit from skilled therapeutic intervention in order to improve the following deficits and impairments:  Decreased activity tolerance, Decreased range of motion, Decreased safety awareness, Pain  Visit Diagnosis: Cervicalgia  Other symptoms and signs involving the musculoskeletal system  Acute pain of right shoulder     Problem List Patient Active Problem List   Diagnosis Date Noted  . Status post cervical spinal fusion 09/10/2016  . Neck pain 09/10/2016    Rayetta Humphrey, PT CLT [phone] 11/05/2016, 8:59 AM  [facility] [address] [place], [place], [address] Phone: [phone]   Fax:  [phone]  Name: Edward Barrett MRN: [id number] Date of Birth: 1965/10/03

## 2016-11-09 ENCOUNTER — Ambulatory Visit (HOSPITAL_COMMUNITY): Payer: BLUE CROSS/BLUE SHIELD | Admitting: Physical Therapy

## 2016-11-09 DIAGNOSIS — R29898 Other symptoms and signs involving the musculoskeletal system: Secondary | ICD-10-CM

## 2016-11-09 DIAGNOSIS — M542 Cervicalgia: Secondary | ICD-10-CM | POA: Diagnosis not present

## 2016-11-09 DIAGNOSIS — M25511 Pain in right shoulder: Secondary | ICD-10-CM

## 2016-11-09 NOTE — Therapy (Signed)
[place], [place], [address] Phone: [phone]   Fax:  [phone]  Physical Therapy Treatment  Patient Details  Name: Edward Barrett MRN: [id number] Date of Birth: 07/13/1965 Referring Provider: Cynda Acres   Encounter Date: 11/09/2016      PT End of Session - 11/09/16 0840    Visit Number 3   Number of Visits 8   Date for PT Re-Evaluation 12/02/16   Authorization Type BCBS   Authorization - Visit Number 3   Authorization - Number of Visits 8   PT Start Time [phone]   PT Stop Time 0900   PT Time Calculation (min) 43 min   Activity Tolerance Patient tolerated treatment well   Behavior During Therapy [facility] for tasks assessed/performed      Past Medical History:  Diagnosis Date  . Arthritis     Past Surgical History:  Procedure Laterality Date  . ANTERIOR CERVICAL DECOMP/DISCECTOMY FUSION N/A 09/10/2016   Procedure: ACDF C5-6;  Surgeon: Melina Schools, MD;  Location: [place];  Service: Orthopedics;  Laterality: N/A;  2.5 hrs  . BACK SURGERY    . FOOT SURGERY Right    blood poisoning  . HAND EXPLORATION Right    tendons etc cut  . I&D EXTREMITY Left    hand  . SHOULDER ARTHROSCOPY Left     There were no vitals filed for this visit.      Subjective Assessment - 11/09/16 0901    Subjective pt states he is improving.  Reports being sore, expecially in Lt shoulder/trap region, however no pain currently.   Currently in Pain? No/denies                         [facility] Adult PT Treatment/Exercise - 11/09/16 0001      Neck Exercises: Machines for Strengthening   UBE (Upper Arm Bike) 4 minutes backward     Neck Exercises: Theraband   Scapula Retraction 10 reps;Green   Shoulder Extension 10 reps;Green   Rows 10 reps;Green     Neck Exercises: Standing   Wall Push Ups 10 reps   Other Standing Exercises w back 3# x 10   Other Standing Exercises wall arches 10 reps     Neck Exercises:  Seated   Other Seated Exercise cervical and thoracic excursion x 5     Manual Therapy   Manual Therapy Soft tissue mobilization   Manual therapy comments done seperate from all other aspects of treatment   Soft tissue mobilization cervical and upper thoracic B                   PT Short Term Goals - 11/05/16 0858      PT SHORT TERM GOAL #1   Title Pt to be able to verbalize the importance of good posture and body mechanics in spinal health   Time 2   Period Weeks   Status On-going     PT SHORT TERM GOAL #2   Title Pt to be able to lift 10 # from waist height carry x 50 feet and lower to 12 off floor, (work sim)   Time 2   Period Weeks   Status On-going     PT SHORT TERM GOAL #3   Title Pt to no longer have stiffness in his neck    Time 2   Period Weeks   Status On-going  PT Long Term Goals - 11/05/16 0858      PT LONG TERM GOAL #1   Title Pt to be able to lift 25 # from waist high, carry 20 feet and place 12" from ground    Time 4   Period Weeks   Status On-going     PT LONG TERM GOAL #2   Title Pt cervical rotation to have increased 10 degrees to allow pt to see his blind side with ease while driving    Time 4   Period Weeks   Status On-going     PT LONG TERM GOAL #3   Title Pt to be able to keep his cervical area stabilized while using both UE to decrease stress on cervical area.   Time 4   Period Weeks   Status On-going               Plan - 11/09/16 0840    Clinical Impression Statement Pt with general improvement, however continues with radiation into Lt index and pinky fingers.  PRogressed wtih standing wall arches this session with cues for form and spinal stabilization.  Improved mobiltiy noted in both cervical and thoracic spine when completing excursions.  Large spasms in bilateral UT resolved with manual techniques.  unable to replicate UE symptoms or locate trigger point with manual.  No pain or symptoms at EOS with  general improvement in soreness.    PT Frequency 2x / week   PT Duration 4 weeks   PT Treatment/Interventions ADLs/Self Care Home Management;Patient/family education;Manual techniques;Therapeutic activities;Therapeutic exercise   PT Next Visit Plan continue to progress stability and reduce UE symptoms.  Begin I,Y,and T's next session and progress to planks.   PT Home Exercise Plan cervical and scapular retraction    Consulted and Agree with Plan of Care Patient      Patient will benefit from skilled therapeutic intervention in order to improve the following deficits and impairments:  Decreased activity tolerance, Decreased range of motion, Decreased safety awareness, Pain  Visit Diagnosis: Cervicalgia  Other symptoms and signs involving the musculoskeletal system  Acute pain of right shoulder     Problem List Patient Active Problem List   Diagnosis Date Noted  . Status post cervical spinal fusion 09/10/2016  . Neck pain 09/10/2016   Edward Barrett, PTA/CLT [phone]  Edward Barrett 11/09/2016, 9:02 AM  [place], [place], [address] Phone: [phone]   Fax:  [phone]  Name: Edward Barrett MRN: [id number] Date of Birth: 1965/12/22

## 2016-11-12 ENCOUNTER — Ambulatory Visit (HOSPITAL_COMMUNITY): Payer: BLUE CROSS/BLUE SHIELD | Admitting: Physical Therapy

## 2016-11-12 ENCOUNTER — Telehealth (HOSPITAL_COMMUNITY): Payer: Self-pay | Admitting: Family Medicine

## 2016-11-12 NOTE — Telephone Encounter (Signed)
11/12/16  wife called and cx said that he would be at the remaining appts.... no reason was given

## 2016-11-17 ENCOUNTER — Ambulatory Visit (HOSPITAL_COMMUNITY): Payer: BLUE CROSS/BLUE SHIELD | Attending: Physician Assistant | Admitting: Physical Therapy

## 2016-11-17 DIAGNOSIS — R29898 Other symptoms and signs involving the musculoskeletal system: Secondary | ICD-10-CM

## 2016-11-17 DIAGNOSIS — M542 Cervicalgia: Secondary | ICD-10-CM

## 2016-11-17 DIAGNOSIS — M25511 Pain in right shoulder: Secondary | ICD-10-CM | POA: Diagnosis present

## 2016-11-17 NOTE — Therapy (Signed)
[place], [place], [address] Phone: [phone]   Fax:  [phone]  Physical Therapy Treatment  Patient Details  Name: Edward Barrett MRN: [id number] Date of Birth: 1965/04/01 Referring Provider: Cynda Acres   Encounter Date: 11/17/2016      PT End of Session - 11/17/16 0838    Visit Number 4   Number of Visits 8   Date for PT Re-Evaluation 12/02/16   Authorization Type BCBS   Authorization - Visit Number 4   Authorization - Number of Visits 8   PT Start Time [phone]   PT Stop Time 3710   PT Time Calculation (min) 41 min   Activity Tolerance Patient tolerated treatment well   Behavior During Therapy [facility] for tasks assessed/performed      Past Medical History:  Diagnosis Date  . Arthritis     Past Surgical History:  Procedure Laterality Date  . ANTERIOR CERVICAL DECOMP/DISCECTOMY FUSION N/A 09/10/2016   Procedure: ACDF C5-6;  Surgeon: Melina Schools, MD;  Location: [place];  Service: Orthopedics;  Laterality: N/A;  2.5 hrs  . BACK SURGERY    . FOOT SURGERY Right    blood poisoning  . HAND EXPLORATION Right    tendons etc cut  . I&D EXTREMITY Left    hand  . SHOULDER ARTHROSCOPY Left     There were no vitals filed for this visit.      Subjective Assessment - 11/17/16 0815    Subjective Pt states that his shoulder is sore.  States that he stays busy at home.     Pertinent History Pt had surgery on his LT shoulder, back, foot.    How long can you sit comfortably? due to lower back is unable to sit for 30 minutes    How long can you stand comfortably? able to stand 30 minutes    How long can you walk comfortably? able to walk as long as he wants    Patient Stated Goals Pt wants to be able to be able to get back normal activity.  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 Yes   Pain Score 6    Pain Location Shoulder   Pain Orientation Left   Pain Descriptors  / Indicators Sore   Pain Type Chronic pain   Pain Onset More than a month ago   Pain Frequency Intermittent   Aggravating Factors  touching it    Pain Relieving Factors percosets                          [facility] Adult PT Treatment/Exercise - 11/17/16 0001      Exercises   Exercises Neck     Neck Exercises: Machines for Strengthening   UBE (Upper Arm Bike) --     Neck Exercises: Theraband   Scapula Retraction 10 reps;Green   Shoulder Extension 10 reps;Green   Rows 10 reps;Green     Neck Exercises: Standing   Wall Push Ups 10 reps   Other Standing Exercises w back 3# x 15   Other Standing Exercises wall arches 10 reps     Neck Exercises: Seated   Shoulder Shrugs 5 reps   Other Seated Exercise cervical and thoracic excursion x 5     Neck Exercises: Prone   Axial Exentsion 5 reps   W Back 10 reps   Rows 10 reps   Other Prone  Exercise Shoulder extension x 10      Manual Therapy   Manual Therapy Soft tissue mobilization   Manual therapy comments done seperate from all other aspects of treatment   Soft tissue mobilization cervical and upper thoracic B                   PT Short Term Goals - 11/05/16 0858      PT SHORT TERM GOAL #1   Title Pt to be able to verbalize the importance of good posture and body mechanics in spinal health   Time 2   Period Weeks   Status On-going     PT SHORT TERM GOAL #2   Title Pt to be able to lift 10 # from waist height carry x 50 feet and lower to 12 off floor, (work sim)   Time 2   Period Weeks   Status On-going     PT SHORT TERM GOAL #3   Title Pt to no longer have stiffness in his neck    Time 2   Period Weeks   Status On-going           PT Long Term Goals - 11/05/16 6629      PT LONG TERM GOAL #1   Title Pt to be able to lift 25 # from waist high, carry 20 feet and place 12" from ground    Time 4   Period Weeks   Status On-going     PT LONG TERM GOAL #2   Title Pt cervical rotation to  have increased 10 degrees to allow pt to see his blind side with ease while driving    Time 4   Period Weeks   Status On-going     PT LONG TERM GOAL #3   Title Pt to be able to keep his cervical area stabilized while using both UE to decrease stress on cervical area.   Time 4   Period Weeks   Status On-going               Plan - 11/17/16 4765    Clinical Impression Statement Pt continues to have soreness in shoulder but does not have radicular sx as he did.   Pt unable to complete I Y and T's due to previous surgery on Lt shoulder pt states that shoulder will dislocate.   PT Frequency 2x / week   PT Duration 4 weeks   PT Treatment/Interventions ADLs/Self Care Home Management;Patient/family education;Manual techniques;Therapeutic activities;Therapeutic exercise   PT Next Visit Plan Begin lifting waist to shoulder height.    PT Home Exercise Plan cervical and scapular retraction    Consulted and Agree with Plan of Care Patient      Patient will benefit from skilled therapeutic intervention in order to improve the following deficits and impairments:  Decreased activity tolerance, Decreased range of motion, Decreased safety awareness, Pain  Visit Diagnosis: Cervicalgia  Other symptoms and signs involving the musculoskeletal system  Acute pain of right shoulder     Problem List Patient Active Problem List   Diagnosis Date Noted  . Status post cervical spinal fusion 09/10/2016  . Neck pain 09/10/2016   Rayetta Humphrey, PT CLT [phone] 11/17/2016, 8:58 AM  [place], [place], [address] Phone: [phone]   Fax:  [phone]  Name: Edward Barrett MRN: [id number] Date of Birth: 27-Apr-1965

## 2016-11-19 ENCOUNTER — Ambulatory Visit (HOSPITAL_COMMUNITY): Payer: BLUE CROSS/BLUE SHIELD | Admitting: Physical Therapy

## 2016-11-19 DIAGNOSIS — R29898 Other symptoms and signs involving the musculoskeletal system: Secondary | ICD-10-CM

## 2016-11-19 DIAGNOSIS — M542 Cervicalgia: Secondary | ICD-10-CM | POA: Diagnosis not present

## 2016-11-19 DIAGNOSIS — M25511 Pain in right shoulder: Secondary | ICD-10-CM

## 2016-11-19 NOTE — Therapy (Signed)
[place], [place], [address] Phone: [phone]   Fax:  [phone]  Physical Therapy Treatment  Patient Details  Name: Edward Barrett MRN: [id number] Date of Birth: 11-19-1965 Referring Provider: Cynda Acres   Encounter Date: 11/19/2016      PT End of Session - 11/19/16 1053    Visit Number 5   Number of Visits 8   Date for PT Re-Evaluation 12/02/16   Authorization Type BCBS   Authorization - Visit Number 5   Authorization - Number of Visits 8   PT Start Time 0817   PT Stop Time 0903   PT Time Calculation (min) 46 min   Activity Tolerance Patient tolerated treatment well   Behavior During Therapy [facility] for tasks assessed/performed      Past Medical History:  Diagnosis Date  . Arthritis     Past Surgical History:  Procedure Laterality Date  . ANTERIOR CERVICAL DECOMP/DISCECTOMY FUSION N/A 09/10/2016   Procedure: ACDF C5-6;  Surgeon: Melina Schools, MD;  Location: [facility];  Service: Orthopedics;  Laterality: N/A;  2.5 hrs  . BACK SURGERY    . FOOT SURGERY Right    blood poisoning  . HAND EXPLORATION Right    tendons etc cut  . I&D EXTREMITY Left    hand  . SHOULDER ARTHROSCOPY Left     There were no vitals filed for this visit.      Subjective Assessment - 11/19/16 0817    Subjective PT states he is improving slowly.  Still having some irritation on Lt side of cervical/shoulder.  4/10. States he goes to [place] on the 14th about his knee and the 20th back to MD that sent him here.    Currently in Pain? Yes   Pain Score 4    Pain Location Shoulder   Pain Orientation Left   Pain Descriptors / Indicators Sore   Pain Radiating Towards Lt cervical and shoulder                         [facility] Adult PT Treatment/Exercise - 11/19/16 0001      Therapeutic Activites    Lifting floor to waist to shoulder 5X 12# box     Neck Exercises: Machines for Strengthening   UBE (Upper Arm  Bike) 4 minutes backward     Neck Exercises: Theraband   Scapula Retraction 15 reps;Green   Shoulder Extension 15 reps;Green   Rows 15 reps;Green     Neck Exercises: Standing   Wall Push Ups 15 reps   Other Standing Exercises w back 3# x 15   Other Standing Exercises wall arches 15 reps     Neck Exercises: Prone   Axial Exentsion 10 reps   W Back 10 reps   Rows 10 reps   Upper Extremity Flexion with Stabilization 5 reps   Other Prone Exercise Shoulder extension x 10      Manual Therapy   Manual Therapy Soft tissue mobilization   Manual therapy comments done seperate from all other aspects of treatment   Soft tissue mobilization cervical and upper thoracic B                   PT Short Term Goals - 11/05/16 3299      PT SHORT TERM GOAL #1   Title Pt to be able to verbalize the importance of good posture and body mechanics in spinal health   Time  2   Period Weeks   Status On-going     PT SHORT TERM GOAL #2   Title Pt to be able to lift 10 # from waist height carry x 50 feet and lower to 12 off floor, (work sim)   Time 2   Period Weeks   Status On-going     PT SHORT TERM GOAL #3   Title Pt to no longer have stiffness in his neck    Time 2   Period Weeks   Status On-going           PT Long Term Goals - 11/05/16 9678      PT LONG TERM GOAL #1   Title Pt to be able to lift 25 # from waist high, carry 20 feet and place 12" from ground    Time 4   Period Weeks   Status On-going     PT LONG TERM GOAL #2   Title Pt cervical rotation to have increased 10 degrees to allow pt to see his blind side with ease while driving    Time 4   Period Weeks   Status On-going     PT LONG TERM GOAL #3   Title Pt to be able to keep his cervical area stabilized while using both UE to decrease stress on cervical area.   Time 4   Period Weeks   Status On-going               Plan - 11/19/16 1054    Clinical Impression Statement Soreness remains present in Rt  shoulder but overall improving without radiculopathy.  Able to increase reps with most exercises.  continues to require cues to complete in correct form and noted improvement in general strength.  Instructed with proper lifting/body mechanics with some knee discomfort voiced during squatting.  Cues required to use correct form, keeping load close and avoidance of twisting with activity. Manual revealed some spasms in Lt upper trap/rolling in muscle.     PT Frequency 2x / week   PT Duration 4 weeks   PT Treatment/Interventions ADLs/Self Care Home Management;Patient/family education;Manual techniques;Therapeutic activities;Therapeutic exercise   PT Next Visit Plan continue to progress toward goals.    PT Home Exercise Plan cervical and scapular retraction    Consulted and Agree with Plan of Care Patient      Patient will benefit from skilled therapeutic intervention in order to improve the following deficits and impairments:  Decreased activity tolerance, Decreased range of motion, Decreased safety awareness, Pain  Visit Diagnosis: Cervicalgia  Other symptoms and signs involving the musculoskeletal system  Acute pain of right shoulder     Problem List Patient Active Problem List   Diagnosis Date Noted  . Status post cervical spinal fusion 09/10/2016  . Neck pain 09/10/2016   [name], PTA/CLT [phone]  [name] 11/19/2016, 11:10 AM  [place], [place], [address] Phone: [phone]   Fax:  [phone]  Name: Edward Barrett MRN: [id number] Date of Birth: 07/27/1965

## 2016-11-23 ENCOUNTER — Ambulatory Visit (HOSPITAL_COMMUNITY): Payer: BLUE CROSS/BLUE SHIELD | Admitting: Physical Therapy

## 2016-11-23 DIAGNOSIS — M542 Cervicalgia: Secondary | ICD-10-CM

## 2016-11-23 DIAGNOSIS — R29898 Other symptoms and signs involving the musculoskeletal system: Secondary | ICD-10-CM

## 2016-11-23 NOTE — Therapy (Addendum)
[facility] [address] [place], [place], [address] Phone: [phone]   Fax:  [phone]  Physical Therapy Treatment  Patient Details  Name: Edward Barrett MRN: [id number] Date of Birth: 02-09-1966 Referring Provider: Cynda Acres   Encounter Date: 11/23/2016      PT End of Session - 11/23/16 0859    Visit Number 6   Number of Visits 8   Date for PT Re-Evaluation 12/02/16   Authorization Type BCBS   Authorization - Visit Number 6   Authorization - Number of Visits 8   PT Start Time 0817   PT Stop Time 0859   PT Time Calculation (min) 42 min   Activity Tolerance Patient tolerated treatment well   Behavior During Therapy [facility] for tasks assessed/performed      Past Medical History:  Diagnosis Date  . Arthritis     Past Surgical History:  Procedure Laterality Date  . ANTERIOR CERVICAL DECOMP/DISCECTOMY FUSION N/A 09/10/2016   Procedure: ACDF C5-6;  Surgeon: Melina Schools, MD;  Location: [facility];  Service: Orthopedics;  Laterality: N/A;  2.5 hrs  . BACK SURGERY    . FOOT SURGERY Right    blood poisoning  . HAND EXPLORATION Right    tendons etc cut  . I&D EXTREMITY Left    hand  . SHOULDER ARTHROSCOPY Left     There were no vitals filed for this visit.      Subjective Assessment - 11/23/16 0819    Subjective Pt states that he slipped going down the steps an grabbed the rail causing a "pop" and pain in his neck.  Now the pain is gone but his neck just feels weird.    Pertinent History Pt had surgery on his LT shoulder, back, foot.    How long can you sit comfortably? due to lower back is unable to sit for 30 minutes    How long can you stand comfortably? able to stand 30 minutes    How long can you walk comfortably? able to walk as long as he wants    Patient Stated Goals Pt wants to be able to be able to get back normal activity.  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 Yes    Pain Score 4    Pain Location Neck   Pain Orientation Left   Pain Descriptors / Indicators Aching   Pain Onset More than a month ago   Aggravating Factors  activitiy                         [facility] Adult PT Treatment/Exercise - 11/23/16 0001      Therapeutic Activites    Lifting floor to waist to shoulder 5X 12# box     Neck Exercises: Theraband   Scapula Retraction 10 reps;Green   Scapula Retraction Limitations D/C to HEP   Shoulder Extension 10 reps;Green   Shoulder Extension Limitations D/C to HEP   Rows 10 reps;Green   Rows Limitations D/C to HEP     Neck Exercises: Standing   Wall Push Ups 15 reps   Other Standing Exercises w back 3# x 15   Other Standing Exercises wall arches 15 reps     Neck Exercises: Seated   Shoulder Shrugs 5 reps   Other Seated Exercise cervical and thoracic excursion x 5     Neck Exercises: Prone   Axial Exentsion --   W  Back --   Rows --   Upper Extremity Flexion with Stabilization --   Other Prone Exercise --     Manual Therapy   Manual Therapy Soft tissue mobilization   Manual therapy comments done seperate from all other aspects of treatment   Soft tissue mobilization cervical and upper thoracic B                   PT Short Term Goals - 11/05/16 0858      PT SHORT TERM GOAL #1   Title Pt to be able to verbalize the importance of good posture and body mechanics in spinal health   Time 2   Period Weeks   Status On-going     PT SHORT TERM GOAL #2   Title Pt to be able to lift 10 # from waist height carry x 50 feet and lower to 12 off floor, (work sim)   Time 2   Period Weeks   Status On-going     PT SHORT TERM GOAL #3   Title Pt to no longer have stiffness in his neck    Time 2   Period Weeks   Status On-going           PT Long Term Goals - 11/05/16 6160      PT LONG TERM GOAL #1   Title Pt to be able to lift 25 # from waist high, carry 20 feet and place 12" from ground    Time 4   Period  Weeks   Status On-going     PT LONG TERM GOAL #2   Title Pt cervical rotation to have increased 10 degrees to allow pt to see his blind side with ease while driving    Time 4   Period Weeks   Status On-going     PT LONG TERM GOAL #3   Title Pt to be able to keep his cervical area stabilized while using both UE to decrease stress on cervical area.   Time 4   Period Weeks   Status On-going               Plan - 11/23/16 0900    Clinical Impression Statement Pt slipped on the stairs this weekend grabbing the rail to prevent himself from falling.  Pt felt a "pop" and had increased pain.  Pain is still at a higher level but is decreasing.  Pt unable to use the bungy cords he has at home for postrue stability exercises due to bands being to low.  Therapist gave him home bands and showed pt how to use in the door.  These exercises will be discharged to HEP.  Pt did have noted increased mm spasm in upper trap today.    PT Frequency 2x / week   PT Duration 4 weeks   PT Treatment/Interventions ADLs/Self Care Home Management;Patient/family education;Manual techniques;Therapeutic activities;Therapeutic exercise   PT Next Visit Plan add prone exercises back into program.    PT Home Exercise Plan cervical and scapular retraction    Consulted and Agree with Plan of Care Patient      Patient will benefit from skilled therapeutic intervention in order to improve the following deficits and impairments:  Decreased activity tolerance, Decreased range of motion, Decreased safety awareness, Pain  Visit Diagnosis: Cervicalgia  Other symptoms and signs involving the musculoskeletal system     Problem List Patient Active Problem List   Diagnosis Date Noted  . Status post cervical spinal fusion  09/10/2016  . Neck pain 09/10/2016    Rayetta Humphrey, PT CLT [phone] 11/23/2016, 9:03 AM  [place] [address] [place], [place],  [address] Phone: [phone]   Fax:  [phone]  Name: Edward Barrett MRN: [id number] Date of Birth: [birth year]  11/23/2017  PHYSICAL THERAPY DISCHARGE SUMMARY  Visits from Start of Care: 6 Current functional level related to goals / functional outcomes: Partially met   Remaining deficits: Pain    Education / Equipment: HEP Plan: Patient agrees to discharge.  Patient goals were partially met. Patient is being discharged due to not returning since the last visit.  ?????    Rayetta Humphrey, [place] CLT [phone]

## 2016-11-26 ENCOUNTER — Ambulatory Visit (HOSPITAL_COMMUNITY): Payer: BLUE CROSS/BLUE SHIELD | Admitting: Physical Therapy

## 2016-11-26 ENCOUNTER — Telehealth (HOSPITAL_COMMUNITY): Payer: Self-pay | Admitting: Physical Therapy

## 2016-11-26 NOTE — Telephone Encounter (Signed)
Pt will see MD Rolena Infante on Thurs about his Neck and then will call us back to reschedule after MD visit if needed. NF 11/26/16

## 2016-11-26 NOTE — Telephone Encounter (Signed)
Attempted to call pt.  No answer left a message re missed appointment.  This was pt last scheduled appointment.  Therapist asked pt to call front office to reschedule.  Rayetta Humphrey, [place] CLT [phone]

## 2016-11-27 ENCOUNTER — Ambulatory Visit: Payer: BLUE CROSS/BLUE SHIELD | Admitting: Orthopedic Surgery

## 2016-12-01 ENCOUNTER — Ambulatory Visit (INDEPENDENT_AMBULATORY_CARE_PROVIDER_SITE_OTHER): Payer: BLUE CROSS/BLUE SHIELD

## 2016-12-01 ENCOUNTER — Ambulatory Visit (INDEPENDENT_AMBULATORY_CARE_PROVIDER_SITE_OTHER): Payer: BLUE CROSS/BLUE SHIELD | Admitting: Orthopedic Surgery

## 2016-12-01 ENCOUNTER — Encounter: Payer: Self-pay | Admitting: Orthopedic Surgery

## 2016-12-01 VITALS — BP 144/85 | HR 64 | Ht 72.0 in | Wt 185.0 lb

## 2016-12-01 DIAGNOSIS — M23321 Other meniscus derangements, posterior horn of medial meniscus, right knee: Secondary | ICD-10-CM | POA: Diagnosis not present

## 2016-12-01 DIAGNOSIS — M25561 Pain in right knee: Secondary | ICD-10-CM

## 2016-12-01 DIAGNOSIS — M1711 Unilateral primary osteoarthritis, right knee: Secondary | ICD-10-CM

## 2016-12-01 MED ORDER — MELOXICAM 7.5 MG PO TABS: 7.5000 mg | ORAL_TABLET | Freq: Every day | ORAL | 0 refills | Status: DC

## 2016-12-01 NOTE — Progress Notes (Signed)
Patient ID: Edward Barrett, male   DOB: 1965-08-23, 51 y.o.   MRN: [id number]  Chief Complaint  Patient presents with  . New Problem    Right knee pain    HPI Edward Barrett is a 51 y.o. male.   51 year old male was seen last year for aspiration injection and swelling and pain in the right knee  He got good relief up until about 2 months ago. He is a Dealer  He complains of moderate dull aching pain medial aspect right knee associated with swelling at night which goes away by morning. He has painful squatting and kneeling and when he turns or pivots on his knee his knee gives out.  No prior treatment.  Recent neck surgery with neck fusion     Review of Systems Review of Systems  Constitutional: Negative for unexpected weight change.  Respiratory: Positive for wheezing.   Cardiovascular: Negative.  Negative for chest pain.  Musculoskeletal: Positive for arthralgias and joint swelling.    Past Medical History:  Diagnosis Date  . Arthritis     Past Surgical History:  Procedure Laterality Date  . ANTERIOR CERVICAL DECOMP/DISCECTOMY FUSION N/A 09/10/2016   Procedure: ACDF C5-6;  Surgeon: Melina Schools, MD;  Location: [facility];  Service: Orthopedics;  Laterality: N/A;  2.5 hrs  . BACK SURGERY    . FOOT SURGERY Right    blood poisoning  . HAND EXPLORATION Right    tendons etc cut  . I&D EXTREMITY Left    hand  . SHOULDER ARTHROSCOPY Left     Family History  Problem Relation Age of Onset  . Cancer Father   . Cancer Sister   . Diabetes Other   . Cancer Other     Social History Social History  Substance Use Topics  . Smoking status: Former Smoker    Years: 4.00    Quit date: 09/03/2014  . Smokeless tobacco: Former Systems developer  . Alcohol use No    Allergies  Allergen Reactions  . No Known Allergies     Current Outpatient Prescriptions  Medication Sig Dispense Refill  . methocarbamol (ROBAXIN) 500 MG tablet Take 1 tablet (500 mg total) by mouth 3 (three) times  daily as needed for muscle spasms. 21 tablet 0  . ondansetron (ZOFRAN) 4 MG tablet Take 1 tablet (4 mg total) by mouth every 8 (eight) hours as needed for nausea or vomiting. 20 tablet 0  . oxyCODONE-acetaminophen (PERCOCET) 10-325 MG tablet Take 1 tablet by mouth every 4 (four) hours as needed for pain. 42 tablet 0   No current facility-administered medications for this visit.        Physical Exam Physical Exam  Musculoskeletal:       Right knee: Medial joint line tenderness noted.   Blood pressure (!) 144/85, pulse 64, height 6' (1.829 m), weight 185 lb (83.9 kg). Appearance, there are no abnormalities in terms of appearance the patient was well-developed and well-nourished. The grooming and hygiene were normal.  Mental status orientation, there was normal alertness and orientation Mood pleasant Ambulatory status normal with no assistive devices  Left Knee Exam   Tenderness  None  Range of Motion  Normal left knee ROM  Muscle Strength  Normal left knee strength  Tests  Drawer:       Anterior - Negative       Right Knee Exam  Effusion: Yes  Tenderness  The patient is experiencing tenderness in the medial joint line.  Range of Motion  Normal right knee ROM  Muscle Strength  Normal right knee strength  Tests  [place]:  Medial - Positive        Comments:  Normal sensation and no peripheral edema right leg     Data Reviewed We ordered an x-ray and the x-ray shows calcified cartilage consistent with chondrocalcinosis, mild to moderate arthritis medial compartment with normal alignment  Assessment  Encounter Diagnoses  Name Primary?  . Right knee pain, unspecified chronicity Yes  . 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 Primary osteoarthritis of right knee       Plan  6 weeks of conservative treatment with physical therapy and anti-inflammatory Mobitz 1 daily  Follow-up in 6 weeks reassess  4:33 PM Arther Abbott,  MD 12/01/2016

## 2016-12-03 ENCOUNTER — Other Ambulatory Visit (HOSPITAL_COMMUNITY): Payer: Self-pay | Admitting: Physician Assistant

## 2016-12-03 ENCOUNTER — Ambulatory Visit (HOSPITAL_COMMUNITY)
Admission: RE | Admit: 2016-12-03 | Discharge: 2016-12-03 | Disposition: A | Discharge status: Home / Self Care | Payer: BLUE CROSS/BLUE SHIELD | Source: Ambulatory Visit | Attending: Cardiology | Admitting: Cardiology

## 2016-12-03 DIAGNOSIS — M25561 Pain in right knee: Secondary | ICD-10-CM

## 2016-12-03 DIAGNOSIS — R600 Localized edema: Secondary | ICD-10-CM | POA: Insufficient documentation

## 2016-12-03 DIAGNOSIS — M79604 Pain in right leg: Secondary | ICD-10-CM | POA: Diagnosis not present

## 2016-12-03 DIAGNOSIS — Z9889 Other specified postprocedural states: Secondary | ICD-10-CM | POA: Diagnosis not present

## 2017-01-13 ENCOUNTER — Ambulatory Visit: Payer: BLUE CROSS/BLUE SHIELD | Admitting: Orthopedic Surgery

## 2017-03-16 HISTORY — PX: KNEE ARTHROSCOPY: SUR90

## 2017-05-31 ENCOUNTER — Encounter: Payer: Self-pay | Admitting: Internal Medicine

## 2017-07-23 ENCOUNTER — Encounter: Payer: Self-pay | Admitting: Gastroenterology

## 2017-07-23 ENCOUNTER — Ambulatory Visit (INDEPENDENT_AMBULATORY_CARE_PROVIDER_SITE_OTHER): Payer: BLUE CROSS/BLUE SHIELD | Admitting: Gastroenterology

## 2017-07-23 ENCOUNTER — Other Ambulatory Visit: Payer: Self-pay | Admitting: *Deleted

## 2017-07-23 ENCOUNTER — Encounter: Payer: Self-pay | Admitting: *Deleted

## 2017-07-23 ENCOUNTER — Telehealth: Payer: Self-pay | Admitting: *Deleted

## 2017-07-23 DIAGNOSIS — Z1211 Encounter for screening for malignant neoplasm of colon: Secondary | ICD-10-CM | POA: Insufficient documentation

## 2017-07-23 DIAGNOSIS — R1012 Left upper quadrant pain: Secondary | ICD-10-CM | POA: Diagnosis not present

## 2017-07-23 MED ORDER — PEG 3350-KCL-NA BICARB-NACL 420 G PO SOLR: 4000.0000 mL | Freq: Once | ORAL | 0 refills | Status: AC

## 2017-07-23 NOTE — Telephone Encounter (Signed)
LMOVM. Pre-op scheduled for 09/03/17 at 11:00am. Letter mailed

## 2017-07-23 NOTE — Progress Notes (Signed)
Primary Care Physician:  Deloria Lair., MD  Primary Gastroenterologist:  Garfield Cornea, MD   Chief Complaint  Patient presents with  . Abdominal Pain    left side; after eating or lying on right side  . Colonoscopy    consult, never had tcs    HPI:  Edward Barrett is a 52 y.o. male here at the request of Dr. Scotty Court for first ever colonoscopy. Patient also with h/o Left sided abdominal pain. U/s done recently showing nonobstructing right kidney stone. Nothing to explain his symptoms.   Patient states his pain is been going on for about 2 years, intermittent in nature.  At times he is pain-free.  He is on chronic Percocet for his back and neck.  Sometimes he notes increased pain in the left upper quadrant underneath the left lower ribs with meals.  No particular trigger foods.  Denies any heartburn, dysphagia, nausea or vomiting, weight loss.  Patient reports about 3 to 4 years ago when he quit smoking he weighed around 145 pounds, currently at 198.  Pain is located over the left lateral ribs as well as anteriorly/behind the ribs.  Sometimes applying pressure helps the pain.  He notices it when he lays on his right side.  If he lays on his left side he does not have pain.  Sometimes when he sitting up if he leans to the right he will have pain on the left.  He reports regular bowel movements.  No blood in the stool or melena.  Current Outpatient Medications  Medication Sig Dispense Refill  . oxyCODONE-acetaminophen (PERCOCET/ROXICET) 5-325 MG tablet Take 1 tablet by mouth 3 (three) times daily.  0   No current facility-administered medications for this visit.     Allergies as of 07/23/2017 - Review Complete 07/23/2017  Allergen Reaction Noted  . No known allergies  09/09/2016    Past Medical History:  Diagnosis Date  . Arthritis   . Epidural fibrosis     Past Surgical History:  Procedure Laterality Date  . ANTERIOR CERVICAL DECOMP/DISCECTOMY FUSION N/A 09/10/2016   Procedure:  ACDF C5-6;  Surgeon: Melina Schools, MD;  Location: [facility];  Service: Orthopedics;  Laterality: N/A;  2.5 hrs  . BACK SURGERY    . FOOT SURGERY Right    blood poisoning  . HAND EXPLORATION Right    tendons etc cut  . I&D EXTREMITY Left    hand  . KNEE ARTHROSCOPY Right 2019  . SHOULDER ARTHROSCOPY Left     Family History  Problem Relation Age of Onset  . Cancer Father        penile  . Cancer Sister   . Diabetes Other   . Cancer Other   . Pancreatic cancer Maternal Grandfather   . Colon cancer Neg Hx     Social History   Socioeconomic History  . Marital status: Married    Spouse name: Not on file  . Number of children: Not on file  . Years of education: Not on file  . Highest education level: Not on file  Occupational History  . Not on file  Social Needs  . Financial resource strain: Not on file  . Food insecurity:    Worry: Not on file    Inability: Not on file  . Transportation needs:    Medical: Not on file    Non-medical: Not on file  Tobacco Use  . Smoking status: Former Smoker    Years: 4.00    Last attempt  to quit: 09/03/2014    Years since quitting: 2.8  . Smokeless tobacco: Former Network engineer and Sexual Activity  . Alcohol use: No  . Drug use: No  . Sexual activity: Not on file  Lifestyle  . Physical activity:    Days per week: Not on file    Minutes per session: Not on file  . Stress: Not on file  Relationships  . Social connections:    Talks on phone: Not on file    Gets together: Not on file    Attends religious service: Not on file    Active member of club or organization: Not on file    Attends meetings of clubs or organizations: Not on file    Relationship status: Not on file  . Intimate partner violence:    Fear of current or ex partner: Not on file    Emotionally abused: Not on file    Physically abused: Not on file    Forced sexual activity: Not on file  Other Topics Concern  . Not on file  Social History Narrative  . Not on  file      ROS:  General: Negative for anorexia, weight loss, fever, chills, fatigue, weakness. Eyes: Negative for vision changes.  ENT: Negative for hoarseness, difficulty swallowing , nasal congestion. CV: Negative for chest pain, angina, palpitations, dyspnea on exertion, peripheral edema.  Respiratory: Negative for dyspnea at rest, dyspnea on exertion, cough, sputum, wheezing.  GI: See history of present illness. GU:  Negative for dysuria, hematuria, urinary incontinence, urinary frequency, nocturnal urination.  MS: Chronic\neck/joint pain  Derm: Negative for rash or itching.  Neuro: Negative for weakness, abnormal sensation, seizure, frequent headaches, memory loss, confusion.  Psych: Negative for anxiety, depression, suicidal ideation, hallucinations.  Endo: Negative for unusual weight change.  Heme: Negative for bruising or bleeding. Allergy: Negative for rash or hives.    Physical Examination:  BP 133/75   Pulse (!) 58   Temp (!) 97 F (36.1 C) (Oral)   Ht 6\' 1"  (1.854 m)   Wt 198 lb 6.4 oz (90 kg)   BMI 26.18 kg/m    General: Well-nourished, well-developed in no acute distress.  Head: Normocephalic, atraumatic.   Eyes: Conjunctiva pink, no icterus. Mouth: Oropharyngeal mucosa moist and pink , no lesions erythema or exudate. Neck: Supple without thyromegaly, masses, or lymphadenopathy.  Lungs: Clear to auscultation bilaterally.  Heart: Regular rate and rhythm, no murmurs rubs or gallops.  Abdomen: Bowel sounds are normal, nontender, nondistended, no hepatosplenomegaly or masses, no abdominal bruits or    hernia , no rebound or guarding.   Rectal: Not performed Extremities: No lower extremity edema. No clubbing or deformities.  Neuro: Alert and oriented x 4 , grossly normal neurologically.  Skin: Warm and dry, no rash or jaundice.   Psych: Alert and cooperative, normal mood and affect.

## 2017-07-23 NOTE — Progress Notes (Signed)
CC'ED TO PCP 

## 2017-07-23 NOTE — Assessment & Plan Note (Signed)
Pain located along the left lateral lower rib cage as well as left upper quadrant.  Mostly positional in nature although patient appreciates some postprandial component.  Abdominal ultrasound unrevealing for etiology of pain.  Discussed with patient, will pursue colonoscopy in the near future.  He may need further imaging to evaluate ongoing pain.  Further recommendations to follow.

## 2017-07-23 NOTE — Patient Instructions (Signed)
Colonoscopy as scheduled. See separate instructions.  

## 2017-07-23 NOTE — Assessment & Plan Note (Signed)
Pleasant 52 year old gentleman presenting to schedule first ever screening colonoscopy.  He has been on chronic pain medication since 1995.  Plan for deep sedation.  I have discussed the risks, alternatives, benefits with regards to but not limited to the risk of reaction to medication, bleeding, infection, perforation and the patient is agreeable to proceed. Written consent to be obtained.

## 2017-09-01 NOTE — Patient Instructions (Signed)
Edward Barrett  09/01/2017     @PREFPERIOPPHARMACY @   Your procedure is scheduled on  09/09/2017 .  Report to [facility] at  1130   A.M.  Call this number if you have problems the morning of surgery:  [phone]   Remember:  Do not eat or drink after midnight.  You may drink clear liquids until (follow the instructions given to you) .  Clear liquids allowed are:                    Water, Juice (non-citric and without pulp), Carbonated beverages, Clear Tea, Black Coffee only, Plain Jell-O only, Gatorade and Plain Popsicles only    Take these medicines the morning of surgery with A SIP OF WATER oxycodone.    Do not wear jewelry, make-up or nail polish.  Do not wear lotions, powders, or perfumes, or deodorant.  Do not shave 48 hours prior to surgery.  Men may shave face and neck.  Do not bring valuables to the hospital.  [facility] is not responsible for any belongings or valuables.  Contacts, dentures or bridgework may not be worn into surgery.  Leave your suitcase in the car.  After surgery it may be brought to your room.  For patients admitted to the hospital, discharge time will be determined by your treatment team.  Patients discharged the day of surgery will not be allowed to drive home.   Name and phone number of your driver:   family Special instructions:  Follow the diet and prep instructions given to you by Dr Roseanne Kaufman office.  Please read over the following fact sheets that you were given. Anesthesia Post-op Instructions and Care and Recovery After Surgery       Colonoscopy, Adult A colonoscopy is an exam to look at the large intestine. It is done to check for problems, such as:  Lumps (tumors).  Growths (polyps).  Swelling (inflammation).  Bleeding.  What happens before the procedure? Eating and drinking Follow instructions from your doctor about eating and drinking. These instructions may include:  A few days before the procedure -  follow a low-fiber diet. ? Avoid nuts. ? Avoid seeds. ? Avoid dried fruit. ? Avoid raw fruits. ? Avoid vegetables.  1-3 days before the procedure - follow a clear liquid diet. Avoid liquids that have red or purple dye. Drink only clear liquids, such as: ? Clear broth or bouillon. ? Black coffee or tea. ? Clear juice. ? Clear soft drinks or sports drinks. ? Gelatin dessert. ? Popsicles.  On the day of the procedure - do not eat or drink anything during the 2 hours before the procedure.  Bowel prep If you were prescribed an oral bowel prep:  Take it as told by your doctor. Starting the day before your procedure, you will need to drink a lot of liquid. The liquid will cause you to poop (have bowel movements) until your poop is almost clear or light green.  If your skin or butt gets irritated from diarrhea, you may: ? Wipe the area with wipes that have medicine in them, such as adult wet wipes with aloe and vitamin E. ? Put something on your skin that soothes the area, such as petroleum jelly.  If you throw up (vomit) while drinking the bowel prep, take a break for up to 60 minutes. Then begin the bowel prep again. If you keep throwing up and you cannot  take the bowel prep without throwing up, call your doctor.  General instructions  Ask your doctor about changing or stopping your normal medicines. This is important if you take diabetes medicines or blood thinners.  Plan to have someone take you home from the hospital or clinic. What happens during the procedure?  An IV tube may be put into one of your veins.  You will be given medicine to help you relax (sedative).  To reduce your risk of infection: ? Your doctors will wash their hands. ? Your anal area will be washed with soap.  You will be asked to lie on your side with your knees bent.  Your doctor will get a long, thin, flexible tube ready. The tube will have a camera and a light on the end.  The tube will be put into  your anus.  The tube will be gently put into your large intestine.  Air will be delivered into your large intestine to keep it open. You may feel some pressure or cramping.  The camera will be used to take photos.  A small tissue sample may be removed from your body to be looked at under a microscope (biopsy). If any possible problems are found, the tissue will be sent to a lab for testing.  If small growths are found, your doctor may remove them and have them checked for cancer.  The tube that was put into your anus will be slowly removed. The procedure may vary among doctors and hospitals. What happens after the procedure?  Your doctor will check on you often until the medicines you were given have worn off.  Do not drive for 24 hours after the procedure.  You may have a small amount of blood in your poop.  You may pass gas.  You may have mild cramps or bloating in your belly (abdomen).  It is up to you to get the results of your procedure. Ask your doctor, or the department performing the procedure, when your results will be ready. This information is not intended to replace advice given to you by your health care provider. Make sure you discuss any questions you have with your health care provider. Document Released: 04/04/2010 Document Revised: 01/01/2016 Document Reviewed: 05/14/2015 Elsevier Interactive Patient Education  2017 [employer]  Colonoscopy, Adult, Care After This sheet gives you information about how to care for yourself after your procedure. Your health care provider may also give you more specific instructions. If you have problems or questions, contact your health care provider. What can I expect after the procedure? After the procedure, it is common to have:  A small amount of blood in your stool for 24 hours after the procedure.  Some gas.  Mild abdominal cramping or bloating.  Follow these instructions at home: General instructions   For the  first 24 hours after the procedure: ? Do not drive or use machinery. ? Do not sign important documents. ? Do not drink alcohol. ? Do your regular daily activities at a slower pace than normal. ? Eat soft, easy-to-digest foods. ? Rest often.  Take over-the-counter or prescription medicines only as told by your health care provider.  It is up to you to get the results of your procedure. Ask your health care provider, or the department performing the procedure, when your results will be ready. Relieving cramping and bloating  Try walking around when you have cramps or feel bloated.  Apply heat to your abdomen as told by your  health care provider. Use a heat source that your health care provider recommends, such as a moist heat pack or a heating pad. ? Place a towel between your skin and the heat source. ? Leave the heat on for 20-30 minutes. ? Remove the heat if your skin turns bright red. This is especially important if you are unable to feel pain, heat, or cold. You may have a greater risk of getting burned. Eating and drinking  Drink enough fluid to keep your urine clear or pale yellow.  Resume your normal diet as instructed by your health care provider. Avoid heavy or fried foods that are hard to digest.  Avoid drinking alcohol for as long as instructed by your health care provider. Contact a health care provider if:  You have blood in your stool 2-3 days after the procedure. Get help right away if:  You have more than a small spotting of blood in your stool.  You pass large blood clots in your stool.  Your abdomen is swollen.  You have nausea or vomiting.  You have a fever.  You have increasing abdominal pain that is not relieved with medicine. This information is not intended to replace advice given to you by your health care provider. Make sure you discuss any questions you have with your health care provider. Document Released: 10/15/2003 Document Revised: 11/25/2015  Document Reviewed: 05/14/2015 Elsevier Interactive Patient Education  2018 [place] Anesthesia is a term that refers to techniques, procedures, and medicines that help a person stay safe and comfortable during a medical procedure. Monitored anesthesia care, or sedation, is one type of anesthesia. Your anesthesia specialist may recommend sedation if you will be having a procedure that does not require you to be unconscious, such as:  Cataract surgery.  A dental procedure.  A biopsy.  A colonoscopy.  During the procedure, you may receive a medicine to help you relax (sedative). There are three levels of sedation:  Mild sedation. At this level, you may feel awake and relaxed. You will be able to follow directions.  Moderate sedation. At this level, you will be sleepy. You may not remember the procedure.  Deep sedation. At this level, you will be asleep. You will not remember the procedure.  The more medicine you are given, the deeper your level of sedation will be. Depending on how you respond to the procedure, the anesthesia specialist may change your level of sedation or the type of anesthesia to fit your needs. An anesthesia specialist will monitor you closely during the procedure. Let your health care provider know about:  Any allergies you have.  All medicines you are taking, including vitamins, herbs, eye drops, creams, and over-the-counter medicines.  Any use of steroids (by mouth or as a cream).  Any problems you or family members have had with sedatives and anesthetic medicines.  Any blood disorders you have.  Any surgeries you have had.  Any medical conditions you have, such as sleep apnea.  Whether you are pregnant or may be pregnant.  Any use of cigarettes, alcohol, or street drugs. What are the risks? Generally, this is a safe procedure. However, problems may occur, including:  Getting too much medicine  (oversedation).  Nausea.  Allergic reaction to medicines.  Trouble breathing. If this happens, a breathing tube may be used to help with breathing. It will be removed when you are awake and breathing on your own.  Heart trouble.  Lung trouble.  Before the  procedure Staying hydrated Follow instructions from your health care provider about hydration, which may include:  Up to 2 hours before the procedure - you may continue to drink clear liquids, such as water, clear fruit juice, black coffee, and plain tea.  Eating and drinking restrictions Follow instructions from your health care provider about eating and drinking, which may include:  8 hours before the procedure - stop eating heavy meals or foods such as meat, fried foods, or fatty foods.  6 hours before the procedure - stop eating light meals or foods, such as toast or cereal.  6 hours before the procedure - stop drinking milk or drinks that contain milk.  2 hours before the procedure - stop drinking clear liquids.  Medicines Ask your health care provider about:  Changing or stopping your regular medicines. This is especially important if you are taking diabetes medicines or blood thinners.  Taking medicines such as aspirin and ibuprofen. These medicines can thin your blood. Do not take these medicines before your procedure if your health care provider instructs you not to.  Tests and exams  You will have a physical exam.  You may have blood tests done to show: ? How well your kidneys and liver are working. ? How well your blood can clot.  General instructions  Plan to have someone take you home from the hospital or clinic.  If you will be going home right after the procedure, plan to have someone with you for 24 hours.  What happens during the procedure?  Your blood pressure, heart rate, breathing, level of pain and overall condition will be monitored.  An IV tube will be inserted into one of your  veins.  Your anesthesia specialist will give you medicines as needed to keep you comfortable during the procedure. This may mean changing the level of sedation.  The procedure will be performed. After the procedure  Your blood pressure, heart rate, breathing rate, and blood oxygen level will be monitored until the medicines you were given have worn off.  Do not drive for 24 hours if you received a sedative.  You may: ? Feel sleepy, clumsy, or nauseous. ? Feel forgetful about what happened after the procedure. ? Have a sore throat if you had a breathing tube during the procedure. ? Vomit. This information is not intended to replace advice given to you by your health care provider. Make sure you discuss any questions you have with your health care provider. Document Released: 11/26/2004 Document Revised: 08/09/2015 Document Reviewed: 06/23/2015 Elsevier Interactive Patient Education  2018 [place], Care After These instructions provide you with information about caring for yourself after your procedure. Your health care provider may also give you more specific instructions. Your treatment has been planned according to current medical practices, but problems sometimes occur. Call your health care provider if you have any problems or questions after your procedure. What can I expect after the procedure? After your procedure, it is common to:  Feel sleepy for several hours.  Feel clumsy and have poor balance for several hours.  Feel forgetful about what happened after the procedure.  Have poor judgment for several hours.  Feel nauseous or vomit.  Have a sore throat if you had a breathing tube during the procedure.  Follow these instructions at home: For at least 24 hours after the procedure:   Do not: ? Participate in activities in which you could fall or become injured. ? Drive. ? Use heavy  machinery. ? Drink alcohol. ? Take sleeping pills or  medicines that cause drowsiness. ? Make important decisions or sign legal documents. ? Take care of children on your own.  Rest. Eating and drinking  Follow the diet that is recommended by your health care provider.  If you vomit, drink water, juice, or soup when you can drink without vomiting.  Make sure you have little or no nausea before eating solid foods. General instructions  Have a responsible adult stay with you until you are awake and alert.  Take over-the-counter and prescription medicines only as told by your health care provider.  If you smoke, do not smoke without supervision.  Keep all follow-up visits as told by your health care provider. This is important. Contact a health care provider if:  You keep feeling nauseous or you keep vomiting.  You feel light-headed.  You develop a rash.  You have a fever. Get help right away if:  You have trouble breathing. This information is not intended to replace advice given to you by your health care provider. Make sure you discuss any questions you have with your health care provider. Document Released: 06/23/2015 Document Revised: 10/23/2015 Document Reviewed: 06/23/2015 Elsevier Interactive Patient Education  [employer].

## 2017-09-03 ENCOUNTER — Encounter (HOSPITAL_COMMUNITY): Payer: Self-pay

## 2017-09-03 ENCOUNTER — Other Ambulatory Visit: Payer: Self-pay

## 2017-09-03 ENCOUNTER — Encounter (HOSPITAL_COMMUNITY)
Admission: RE | Admit: 2017-09-03 | Discharge: 2017-09-03 | Disposition: A | Discharge status: Home / Self Care | Payer: BLUE CROSS/BLUE SHIELD | Source: Ambulatory Visit | Attending: Internal Medicine | Admitting: Internal Medicine

## 2017-09-03 DIAGNOSIS — Z01812 Encounter for preprocedural laboratory examination: Secondary | ICD-10-CM | POA: Insufficient documentation

## 2017-09-03 DIAGNOSIS — Z01810 Encounter for preprocedural cardiovascular examination: Secondary | ICD-10-CM | POA: Insufficient documentation

## 2017-09-03 DIAGNOSIS — R001 Bradycardia, unspecified: Secondary | ICD-10-CM | POA: Insufficient documentation

## 2017-09-03 HISTORY — DX: Personal history of urinary calculi: Z87.442

## 2017-09-03 LAB — CBC WITH DIFFERENTIAL/PLATELET
BASOS PCT: 1 %
Basophils Absolute: 0.1 10*3/uL (ref 0.0–0.1)
EOS ABS: 0.5 10*3/uL (ref 0.0–0.7)
Eosinophils Relative: 8 %
HCT: 45 % (ref 39.0–52.0)
HEMOGLOBIN: 14.9 g/dL (ref 13.0–17.0)
Lymphocytes Relative: 26 %
Lymphs Abs: 1.6 10*3/uL (ref 0.7–4.0)
MCH: 30.3 pg (ref 26.0–34.0)
MCHC: 33.1 g/dL (ref 30.0–36.0)
MCV: 91.5 fL (ref 78.0–100.0)
MONOS PCT: 7 %
Monocytes Absolute: 0.4 10*3/uL (ref 0.1–1.0)
NEUTROS PCT: 58 %
Neutro Abs: 3.7 10*3/uL (ref 1.7–7.7)
Platelets: 195 10*3/uL (ref 150–400)
RBC: 4.92 MIL/uL (ref 4.22–5.81)
RDW: 13.1 % (ref 11.5–15.5)
WBC: 6.3 10*3/uL (ref 4.0–10.5)

## 2017-09-08 ENCOUNTER — Telehealth: Payer: Self-pay

## 2017-09-08 NOTE — Telephone Encounter (Signed)
Called pt, TCS for tomorrow moved up to 12:30pm, arrive at 10:30am. Advised to drink 2nd half of prep tomorrow at 7:30am. NPO after 9:30am. Endo scheduler aware.

## 2017-09-09 ENCOUNTER — Ambulatory Visit (HOSPITAL_COMMUNITY): Payer: BLUE CROSS/BLUE SHIELD | Admitting: Anesthesiology

## 2017-09-09 ENCOUNTER — Encounter (HOSPITAL_COMMUNITY)
Admission: RE | Disposition: A | Discharge status: Home / Self Care | Payer: Self-pay | Source: Ambulatory Visit | Attending: Internal Medicine

## 2017-09-09 ENCOUNTER — Ambulatory Visit (HOSPITAL_COMMUNITY)
Admission: RE | Admit: 2017-09-09 | Discharge: 2017-09-09 | Disposition: A | Discharge status: Home / Self Care | Payer: BLUE CROSS/BLUE SHIELD | Source: Ambulatory Visit | Attending: Internal Medicine | Admitting: Internal Medicine

## 2017-09-09 ENCOUNTER — Encounter (HOSPITAL_COMMUNITY): Payer: Self-pay | Admitting: *Deleted

## 2017-09-09 DIAGNOSIS — G8929 Other chronic pain: Secondary | ICD-10-CM | POA: Insufficient documentation

## 2017-09-09 DIAGNOSIS — M1990 Unspecified osteoarthritis, unspecified site: Secondary | ICD-10-CM | POA: Insufficient documentation

## 2017-09-09 DIAGNOSIS — D124 Benign neoplasm of descending colon: Secondary | ICD-10-CM | POA: Diagnosis not present

## 2017-09-09 DIAGNOSIS — Z981 Arthrodesis status: Secondary | ICD-10-CM | POA: Diagnosis not present

## 2017-09-09 DIAGNOSIS — Z87891 Personal history of nicotine dependence: Secondary | ICD-10-CM | POA: Insufficient documentation

## 2017-09-09 DIAGNOSIS — Z1211 Encounter for screening for malignant neoplasm of colon: Secondary | ICD-10-CM | POA: Insufficient documentation

## 2017-09-09 DIAGNOSIS — Z800 Family history of malignant neoplasm of digestive organs: Secondary | ICD-10-CM | POA: Insufficient documentation

## 2017-09-09 DIAGNOSIS — Z1212 Encounter for screening for malignant neoplasm of rectum: Secondary | ICD-10-CM

## 2017-09-09 DIAGNOSIS — R1032 Left lower quadrant pain: Secondary | ICD-10-CM | POA: Insufficient documentation

## 2017-09-09 HISTORY — PX: POLYPECTOMY: SHX5525

## 2017-09-09 HISTORY — PX: COLONOSCOPY WITH PROPOFOL: SHX5780

## 2017-09-09 SURGERY — COLONOSCOPY WITH PROPOFOL
Anesthesia: General

## 2017-09-09 MED ORDER — PROPOFOL 10 MG/ML IV BOLUS
INTRAVENOUS | Status: DC | PRN
  Administered 2017-09-09 (×2): 10 mg via INTRAVENOUS

## 2017-09-09 MED ORDER — PROPOFOL 500 MG/50ML IV EMUL
INTRAVENOUS | Status: DC | PRN
  Administered 2017-09-09: 150 ug/kg/min via INTRAVENOUS
  Administered 2017-09-09: 14:00:00 via INTRAVENOUS

## 2017-09-09 MED ORDER — MIDAZOLAM HCL 5 MG/5ML IJ SOLN
INTRAMUSCULAR | Status: DC | PRN
  Administered 2017-09-09: 2 mg via INTRAVENOUS

## 2017-09-09 MED ORDER — CHLORHEXIDINE GLUCONATE CLOTH 2 % EX PADS: 6.0000 | MEDICATED_PAD | Freq: Once | CUTANEOUS | Status: DC

## 2017-09-09 MED ORDER — LACTATED RINGERS IV SOLN
INTRAVENOUS | Status: DC
  Administered 2017-09-09: 1000 mL via INTRAVENOUS

## 2017-09-09 MED ORDER — PROPOFOL 10 MG/ML IV BOLUS
INTRAVENOUS | Status: AC
  Filled 2017-09-09: qty 40

## 2017-09-09 MED ORDER — MIDAZOLAM HCL 2 MG/2ML IJ SOLN
INTRAMUSCULAR | Status: AC
  Filled 2017-09-09: qty 2

## 2017-09-09 NOTE — Transfer of Care (Signed)
Immediate Anesthesia Transfer of Care Note  Patient: Edward Barrett  Procedure(s) Performed: COLONOSCOPY WITH PROPOFOL (N/A ) POLYPECTOMY  Patient Location: PACU  Anesthesia Type:MAC  Level of Consciousness: sedated  Airway & Oxygen Therapy: Patient Spontanous Breathing  Post-op Assessment: Report given to RN  Post vital signs: Reviewed and stable  Last Vitals:  Vitals Value Taken Time  BP 79/43 09/09/2017  2:12 PM  Temp    Pulse 72 09/09/2017  2:15 PM  Resp 22 09/09/2017  2:14 PM  SpO2 100 % 09/09/2017  2:15 PM  Vitals shown include unvalidated device data.  Last Pain:  Vitals:   09/09/17 1035  TempSrc: Oral  PainSc: 0-No pain      Patients Stated Pain Goal: 6 ([id number])  Complications: No apparent anesthesia complications

## 2017-09-09 NOTE — Anesthesia Postprocedure Evaluation (Signed)
Anesthesia Post Note  Patient: Edward Barrett  Procedure(s) Performed: COLONOSCOPY WITH PROPOFOL (N/A ) POLYPECTOMY  Patient location during evaluation: PACU Anesthesia Type: General Level of consciousness: awake and alert and oriented Pain management: pain level controlled Vital Signs Assessment: post-procedure vital signs reviewed and stable Respiratory status: spontaneous breathing Cardiovascular status: blood pressure returned to baseline Postop Assessment: no apparent nausea or vomiting Anesthetic complications: no     Last Vitals:  Vitals:   09/09/17 1040 09/09/17 1415  BP:  105/66  Pulse:  72  Resp: 15 (!) 22  Temp:  36.6 C  SpO2: 98% 100%    Last Pain:  Vitals:   09/09/17 1415  TempSrc:   PainSc: 0-No pain                 Eliyah Bazzi

## 2017-09-09 NOTE — Anesthesia Preprocedure Evaluation (Signed)
Anesthesia Evaluation  Patient identified by MRN, date of birth, ID band Patient awake    Reviewed: Allergy & Precautions, H&P , NPO status , Patient's Chart, lab work & pertinent test results  Airway Mallampati: II  TM Distance: >3 FB Neck ROM: limited    Dental no notable dental hx. (+) Edentulous Upper, Partial Lower   Pulmonary neg pulmonary ROS, former smoker,    Pulmonary exam normal breath sounds clear to auscultation       Cardiovascular Exercise Tolerance: Good negative cardio ROS   Rhythm:regular Rate:Normal     Neuro/Psych negative neurological ROS  negative psych ROS   GI/Hepatic negative GI ROS, Neg liver ROS,   Endo/Other  negative endocrine ROS  Renal/GU negative Renal ROS  negative genitourinary   Musculoskeletal   Abdominal   Peds  Hematology negative hematology ROS (+)   Anesthesia Other Findings Status post cervical spinal fusion Neck pain    Reproductive/Obstetrics negative OB ROS                             Anesthesia Physical Anesthesia Plan  ASA: II  Anesthesia Plan: General   Post-op Pain Management:    Induction:   PONV Risk Score and Plan:   Airway Management Planned:   Additional Equipment:   Intra-op Plan:   Post-operative Plan:   Informed Consent: I have reviewed the patients History and Physical, chart, labs and discussed the procedure including the risks, benefits and alternatives for the proposed anesthesia with the patient or authorized representative who has indicated his/her understanding and acceptance.   Dental Advisory Given  Plan Discussed with: CRNA  Anesthesia Plan Comments:         Anesthesia Quick Evaluation

## 2017-09-09 NOTE — H&P (Signed)
@LOGO @   Primary Care Physician:  Sandi Mealy, MD Primary Gastroenterologist:  Dr. Gala Romney  Pre-Procedure History & Physical: HPI:  Edward Barrett is a 52 y.o. male here for first ever screening colonoscopy. Has chronic left lower quadrant abdominal pain. Otherwise, has no bowel symptoms. No family history of colon cancer.  Past Medical History:  Diagnosis Date  . Arthritis   . Epidural fibrosis   . History of kidney stones     Past Surgical History:  Procedure Laterality Date  . ANTERIOR CERVICAL DECOMP/DISCECTOMY FUSION N/A 09/10/2016   Procedure: ACDF C5-6;  Surgeon: Melina Schools, MD;  Location: [facility];  Service: Orthopedics;  Laterality: N/A;  2.5 hrs  . BACK SURGERY    . FOOT SURGERY Right    blood poisoning  . HAND EXPLORATION Right    tendons etc cut  . I&D EXTREMITY Left    hand  . KNEE ARTHROSCOPY Right 2019  . SHOULDER ARTHROSCOPY Left     Prior to Admission medications   Medication Sig Start Date End Date Taking? Authorizing Provider  ketoconazole (NIZORAL) 2 % cream Apply 1 application topically daily as needed. Scaly rash 06/01/17  Yes [provider]  oxyCODONE-acetaminophen (PERCOCET/ROXICET) 5-325 MG tablet Take 1 tablet by mouth 3 (three) times daily as needed (for pain.).  07/11/17  Yes [provider]    Allergies as of 07/23/2017 - Review Complete 07/23/2017  Allergen Reaction Noted  . No known allergies  09/09/2016    Family History  Problem Relation Age of Onset  . Cancer Father        penile  . Cancer Sister   . Diabetes Other   . Cancer Other   . Pancreatic cancer Maternal Grandfather   . Colon cancer Neg Hx     Social History   Socioeconomic History  . Marital status: Married    Spouse name: Not on file  . Number of children: Not on file  . Years of education: Not on file  . Highest education level: Not on file  Occupational History  . Not on file  Social Needs  . Financial resource strain: Not on file  .  Food insecurity:    Worry: Not on file    Inability: Not on file  . Transportation needs:    Medical: Not on file    Non-medical: Not on file  Tobacco Use  . Smoking status: Former Smoker    Packs/day: 1.50    Years: 4.00    Pack years: 6.00    Types: Cigarettes    Last attempt to quit: 09/03/2014    Years since quitting: 3.0  . Smokeless tobacco: Former Network engineer and Sexual Activity  . Alcohol use: No  . Drug use: No  . Sexual activity: Yes    Birth control/protection: None  Lifestyle  . Physical activity:    Days per week: Not on file    Minutes per session: Not on file  . Stress: Not on file  Relationships  . Social connections:    Talks on phone: Not on file    Gets together: Not on file    Attends religious service: Not on file    Active member of club or organization: Not on file    Attends meetings of clubs or organizations: Not on file    Relationship status: Not on file  . Intimate partner violence:    Fear of current or ex partner: Not on file    Emotionally  abused: Not on file    Physically abused: Not on file    Forced sexual activity: Not on file  Other Topics Concern  . Not on file  Social History Narrative  . Not on file    Review of Systems: See HPI, otherwise negative ROS  Physical Exam: BP 129/74   Pulse 60   Resp 15   SpO2 98%  General:   Alert,  Well-developed, well-nourished, pleasant and cooperative in NAD Lungs:  Clear throughout to auscultation.   No wheezes, crackles, or rhonchi. No acute distress. Heart:  Regular rate and rhythm; no murmurs, clicks, rubs,  or gallops. Abdomen: Non-distended, normal bowel sounds.  Soft and nontender without appreciable mass or hepatosplenomegaly.  Pulses:  Normal pulses noted. Extremities:  Without clubbing or edema.  Impression/Plan:   Pleasant 52 year old gentleman here for first ever screening colonoscopy. The risks, benefits, limitations, alternatives and imponderables have been reviewed  with the patient. Potential for esophageal dilation, biopsy, etc. have also been reviewed.  Questions have been answered. All parties agreeable.     Notice: This dictation was prepared with Dragon dictation along with smaller phrase technology. Any transcriptional errors that result from this process are unintentional and may not be corrected upon review.

## 2017-09-09 NOTE — Discharge Instructions (Signed)
°Colonoscopy °Discharge Instructions ° °Read the instructions outlined below and refer to this sheet in the next few weeks. These discharge instructions provide you with general information on caring for yourself after you leave the hospital. Your doctor may also give you specific instructions. While your treatment has been planned according to the most current medical practices available, unavoidable complications occasionally occur. If you have any problems or questions after discharge, call Dr. Rourk at [phone]. °ACTIVITY °· You may resume your regular activity, but move at a slower pace for the next 24 hours.  °· Take frequent rest periods for the next 24 hours.  °· Walking will help get rid of the air and reduce the bloated feeling in your belly (abdomen).  °· No driving for 24 hours (because of the medicine (anesthesia) used during the test).   °· Do not sign any important legal documents or operate any machinery for 24 hours (because of the anesthesia used during the test).  °NUTRITION °· Drink plenty of fluids.  °· You may resume your normal diet as instructed by your doctor.  °· Begin with a light meal and progress to your normal diet. Heavy or fried foods are harder to digest and may make you feel sick to your stomach (nauseated).  °· Avoid alcoholic beverages for 24 hours or as instructed.  °MEDICATIONS °· You may resume your normal medications unless your doctor tells you otherwise.  °WHAT YOU CAN EXPECT TODAY °· Some feelings of bloating in the abdomen.  °· Passage of more gas than usual.  °· Spotting of blood in your stool or on the toilet paper.  °IF YOU HAD POLYPS REMOVED DURING THE COLONOSCOPY: °· No aspirin products for 7 days or as instructed.  °· No alcohol for 7 days or as instructed.  °· Eat a soft diet for the next 24 hours.  °FINDING OUT THE RESULTS OF YOUR TEST °Not all test results are available during your visit. If your test results are not back during the visit, make an appointment  with your caregiver to find out the results. Do not assume everything is normal if you have not heard from your caregiver or the medical facility. It is important for you to follow up on all of your test results.  °SEEK IMMEDIATE MEDICAL ATTENTION IF: °· You have more than a spotting of blood in your stool.  °· Your belly is swollen (abdominal distention).  °· You are nauseated or vomiting.  °· You have a temperature over 101.  °· You have abdominal pain or discomfort that is severe or gets worse throughout the day.  ° ° °Colon polyp information provided ° °Further recommendations to follow pending review of pathology report ° ° ° ° °Colon Polyps °Polyps are tissue growths inside the body. Polyps can grow in many places, including the large intestine (colon). A polyp may be a round bump or a mushroom-shaped growth. You could have one polyp or several. °Most colon polyps are noncancerous (benign). However, some colon polyps can become cancerous over time. °What are the causes? °The exact cause of colon polyps is not known. °What increases the risk? °This condition is more likely to develop in people who: °· Have a family history of colon cancer or colon polyps. °· Are older than 50 or older than 45 if they are African American. °· Have inflammatory bowel disease, such as ulcerative colitis or Crohn disease. °· Are overweight. °· Smoke cigarettes. °· Do not get enough exercise. °· Drink too   much alcohol. °· Eat a diet that is: °? High in fat and red meat. °? Low in fiber. °· Had childhood cancer that was treated with abdominal radiation. ° °What are the signs or symptoms? °Most polyps do not cause symptoms. If you have symptoms, they may include: °· Blood coming from your rectum when having a bowel movement. °· Blood in your stool. The stool may look dark red or black. °· A change in bowel habits, such as constipation or diarrhea. ° °How is this diagnosed? °This condition is diagnosed with a colonoscopy. This is a  procedure that uses a lighted, flexible scope to look at the inside of your colon. °How is this treated? °Treatment for this condition involves removing any polyps that are found. Those polyps will then be tested for cancer. If cancer is found, your health care provider will talk to you about options for colon cancer treatment. °Follow these instructions at home: °Diet °· Eat plenty of fiber, such as fruits, vegetables, and whole grains. °· Eat foods that are high in calcium and vitamin D, such as milk, cheese, yogurt, eggs, liver, fish, and broccoli. °· Limit foods high in fat, red meats, and processed meats, such as hot dogs, sausage, bacon, and lunch meats. °· Maintain a healthy weight, or lose weight if recommended by your health care provider. °General instructions °· Do not smoke cigarettes. °· Do not drink alcohol excessively. °· Keep all follow-up visits as told by your health care provider. This is important. This includes keeping regularly scheduled colonoscopies. Talk to your health care provider about when you need a colonoscopy. °· Exercise every day or as told by your health care provider. °Contact a health care provider if: °· You have new or worsening bleeding during a bowel movement. °· You have new or increased blood in your stool. °· You have a change in bowel habits. °· You unexpectedly lose weight. °This information is not intended to replace advice given to you by your health care provider. Make sure you discuss any questions you have with your health care provider. °Document Released: 11/27/2003 Document Revised: 08/08/2015 Document Reviewed: 01/21/2015 °Elsevier Interactive Patient Education © 2018 [employer] Inc. ° ° ° ° °Monitored Anesthesia Care, Care After °These instructions provide you with information about caring for yourself after your procedure. Your health care provider may also give you more specific instructions. Your treatment has been planned according to current medical  practices, but problems sometimes occur. Call your health care provider if you have any problems or questions after your procedure. °What can I expect after the procedure? °After your procedure, it is common to: °· Feel sleepy for several hours. °· Feel clumsy and have poor balance for several hours. °· Feel forgetful about what happened after the procedure. °· Have poor judgment for several hours. °· Feel nauseous or vomit. °· Have a sore throat if you had a breathing tube during the procedure. ° °Follow these instructions at home: °For at least 24 hours after the procedure: ° °· Do not: °? Participate in activities in which you could fall or become injured. °? Drive. °? Use heavy machinery. °? Drink alcohol. °? Take sleeping pills or medicines that cause drowsiness. °? Make important decisions or sign legal documents. °? Take care of children on your own. °· Rest. °Eating and drinking °· Follow the diet that is recommended by your health care provider. °· If you vomit, drink water, juice, or soup when you can drink without vomiting. °· Make sure   you have little or no nausea before eating solid foods. °General instructions °· Have a responsible adult stay with you until you are awake and alert. °· Take over-the-counter and prescription medicines only as told by your health care provider. °· If you smoke, do not smoke without supervision. °· Keep all follow-up visits as told by your health care provider. This is important. °Contact a health care provider if: °· You keep feeling nauseous or you keep vomiting. °· You feel light-headed. °· You develop a rash. °· You have a fever. °Get help right away if: °· You have trouble breathing. °This information is not intended to replace advice given to you by your health care provider. Make sure you discuss any questions you have with your health care provider. °Document Released: 06/23/2015 Document Revised: 10/23/2015 Document Reviewed: 06/23/2015 °Elsevier Interactive  Patient Education © 2018 Elsevier Inc. ° °

## 2017-09-09 NOTE — Op Note (Signed)
[facility] Patient Name: Edward Barrett Procedure Date: 09/09/2017 1:43 PM MRN: [id number] Date of Birth: Aug 11, 1965 Attending MD: Norvel Richards , MD CSN: [id number] Age: 52 Admit Type: Outpatient Procedure:                Colonoscopy Indications:              Screening for colorectal malignant neoplasm Providers:                Norvel Richards, MD, Janeece Riggers, RN, Randa Spike, Technician Referring MD:              Medicines:                Propofol per Anesthesia Complications:            No immediate complications. Estimated Blood Loss:     Estimated blood loss was minimal. Procedure:                Pre-Anesthesia Assessment:                           - Prior to the procedure, a History and Physical                            was performed, and patient medications and                            allergies were reviewed. The patient's tolerance of                            previous anesthesia was also reviewed. The risks                            and benefits of the procedure and the sedation                            options and risks were discussed with the patient.                            All questions were answered, and informed consent                            was obtained. Prior Anticoagulants: The patient has                            taken no previous anticoagulant or antiplatelet                            agents. ASA Grade Assessment: III - A patient with                            severe systemic disease. After reviewing the risks  and benefits, the patient was deemed in                            satisfactory condition to undergo the procedure.                           After obtaining informed consent, the colonoscope                            was passed under direct vision. Throughout the                            procedure, the patient's blood pressure, pulse, and        oxygen saturations were monitored continuously. The                            EC-[id number] ([id number]) scope was introduced through                            the and advanced to the 5 cm into the ileum. The                            colonoscopy was performed without difficulty. The                            patient tolerated the procedure well. The quality                            of the bowel preparation was adequate. The entire                            colon was well visualized. Scope In: 1:50:53 PM Scope Out: 2:03:53 PM Scope Withdrawal Time: 0 hours 9 minutes 37 seconds  Total Procedure Duration: 0 hours 13 minutes 0 seconds  Findings:      The perianal and digital rectal examinations were normal.      A 4 mm polyp was found in the descending colon. The polyp was sessile.       The polyp was removed with a cold snare. Resection and retrieval were       complete. Estimated blood loss was minimal.      The exam was otherwise without abnormality on direct and retroflexion       views. Distal 5 cm of termileum mucosa appearde normal/ Impression:               - One 4 mm polyp in the descending colon, removed                            with a cold snare. Resected and retrieved.                           - The examination was otherwise normal on direct                            and retroflexion  views. Moderate Sedation:      Moderate (conscious) sedation was personally administered by an       anesthesia professional. The following parameters were monitored: oxygen       saturation, heart rate, blood pressure, respiratory rate, EKG, adequacy       of pulmonary ventilation, and response to care. Total physician       intraservice time was 18 minutes. Recommendation:           - Patient has a contact number available for                            emergencies. The signs and symptoms of potential                            delayed complications were discussed with the                             patient. Return to normal activities tomorrow.                            Written discharge instructions were provided to the                            patient.                           - Resume previous diet.                           - Continue present medications.                           - Repeat colonoscopy date to be determined after                            pending pathology results are reviewed for                            surveillance based on pathology results.                           - Return to GI office (date not yet determined). Procedure Code(s):        --- Professional ---                           [phone], Colonoscopy, flexible; with removal of                            tumor(s), polyp(s), or other lesion(s) by snare                            technique Diagnosis Code(s):        --- Professional ---                           Z12.11, Encounter for screening for malignant  neoplasm of colon                           D12.4, Benign neoplasm of descending colon CPT copyright 2017 American Medical Association. All rights reserved. The codes documented in this report are preliminary and upon coder review may  be revised to meet current compliance requirements. Edward Estimable. Rourk, MD Norvel Richards, MD 09/09/2017 2:12:45 PM This report has been signed electronically. Number of Addenda: 0

## 2017-09-13 ENCOUNTER — Encounter (HOSPITAL_COMMUNITY): Payer: Self-pay | Admitting: Internal Medicine

## 2017-09-16 ENCOUNTER — Encounter: Payer: Self-pay | Admitting: Internal Medicine

## 2017-12-21 ENCOUNTER — Encounter (HOSPITAL_COMMUNITY): Payer: Self-pay | Admitting: Physical Therapy

## 2017-12-21 NOTE — Therapy (Signed)
[facility] [address] [place], [place], [address] Phone: [phone]   Fax:  [phone]  Patient Details  Name: Edward Barrett MRN: [id number] Date of Birth: [birth year] Referring Provider:  Cynda Acres   Encounter Date: 12/21/2017   PHYSICAL THERAPY DISCHARGE SUMMARY  Visits from Start of Care: 6  Current functional level related to goals / functional outcomes: Unknown as pt did not return    Remaining deficits: Unknown as pt did not return  Education / Equipment: HEP Plan: Patient agrees to discharge.  Patient goals were partially met. Patient is being discharged due to not returning since the last visit.  ?????        Rayetta Humphrey, PT CLT [id number] 12/21/2017, 2:53 PM  [facility] [address] [place], [place], [address] Phone: [phone]   Fax:  [phone]

## 2018-05-15 IMAGING — RF DG CERVICAL SPINE 2 OR 3 VIEWS
1 series · 2 of 2 positions shown · non-contrast
Comparison: None.

CLINICAL DATA: Anterior fusion at C5-6

EXAM:
CERVICAL SPINE - 2-3 VIEW

[Series 1: run · 2 of 2 slices shown]
[im 1/2]
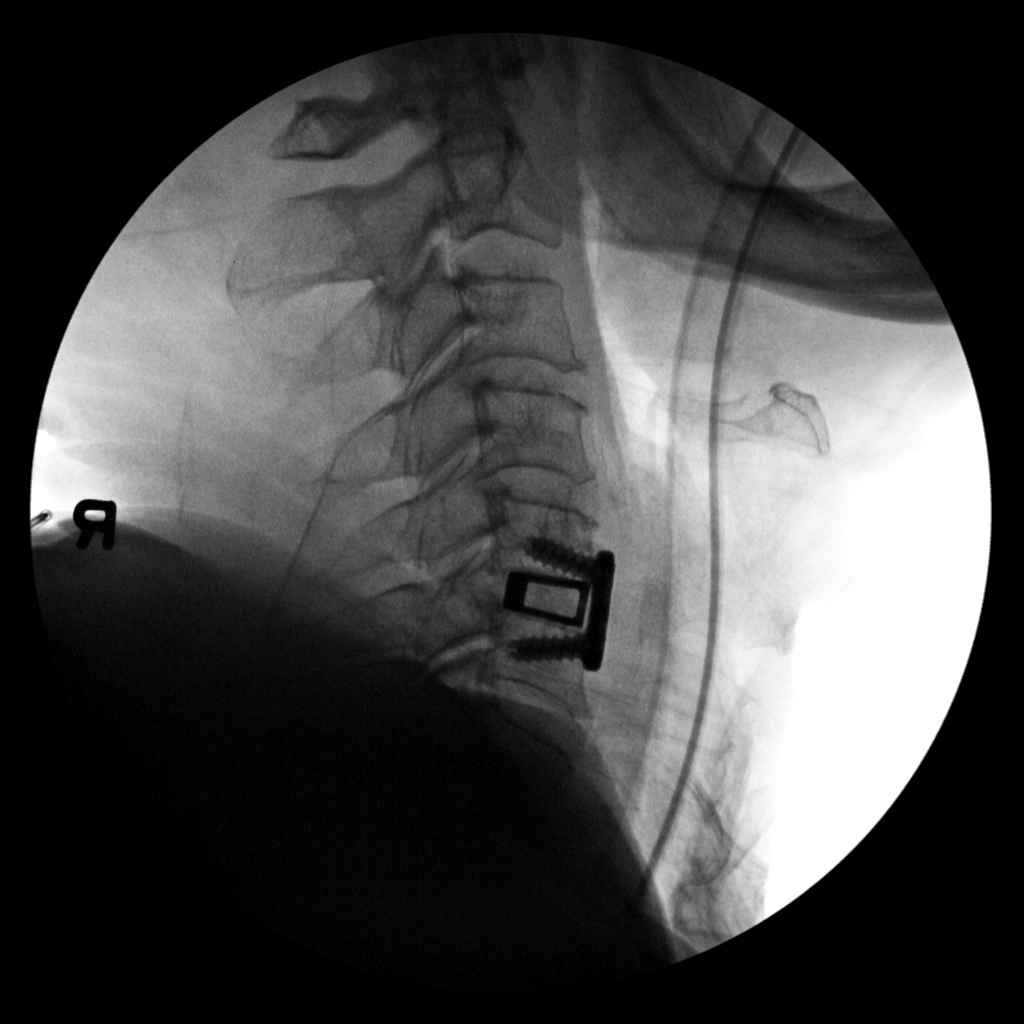
[im 2/2]
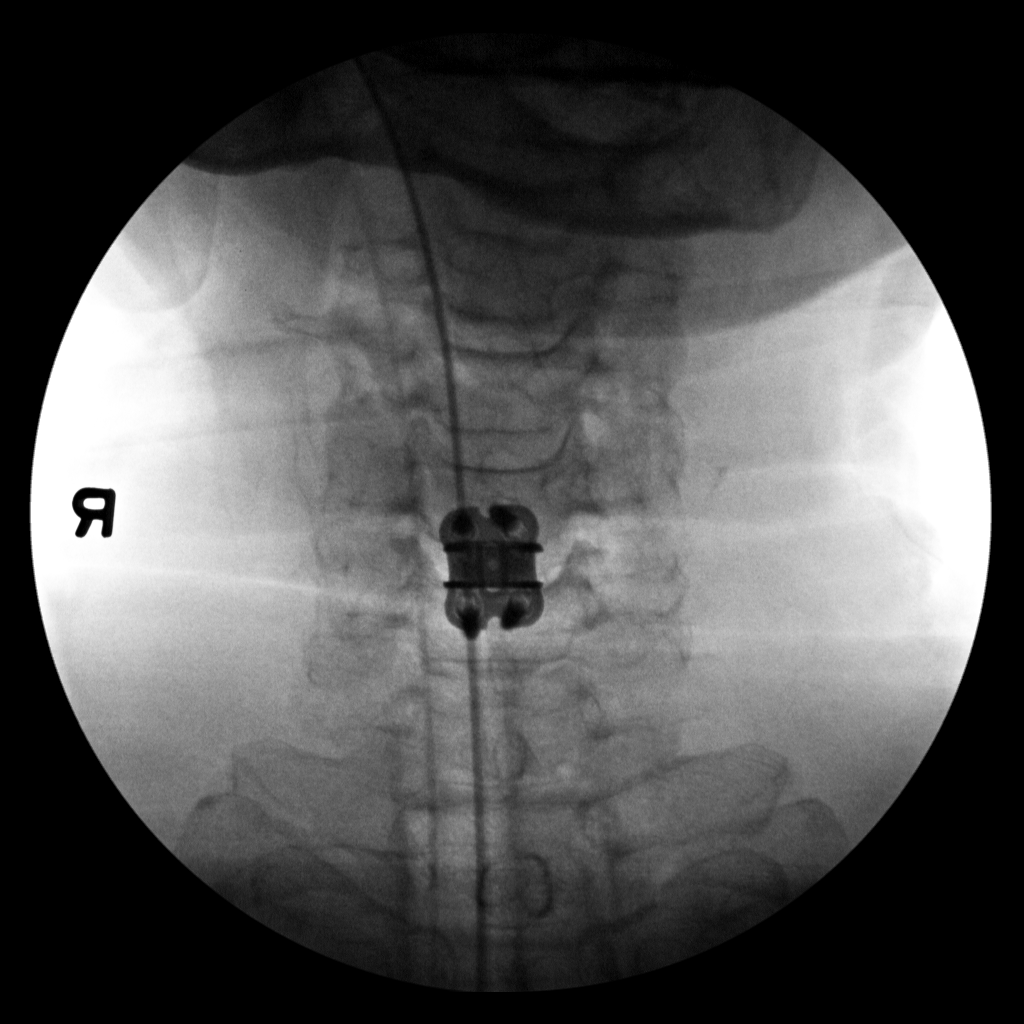

[2 of 2 positions shown; findings below may reference images not displayed]

FINDINGS: Two C-arm spot films show anterior fusion at C5-6. The anterior
metallic fixation plate is in good position as is the interbody
fusion device. No complicating features are seen.
IMPRESSION: Anterior fusion at C5-6.

## 2018-05-15 IMAGING — DX DG CERVICAL SPINE 2 OR 3 VIEWS
2 series · 2 of 2 positions shown · non-contrast
Comparison: Intraoperative fluoro spot views of today's date

CLINICAL DATA: Status post C5-6 ACDF.

EXAM:
CERVICAL SPINE - 2-3 VIEW

[c-spine lat]
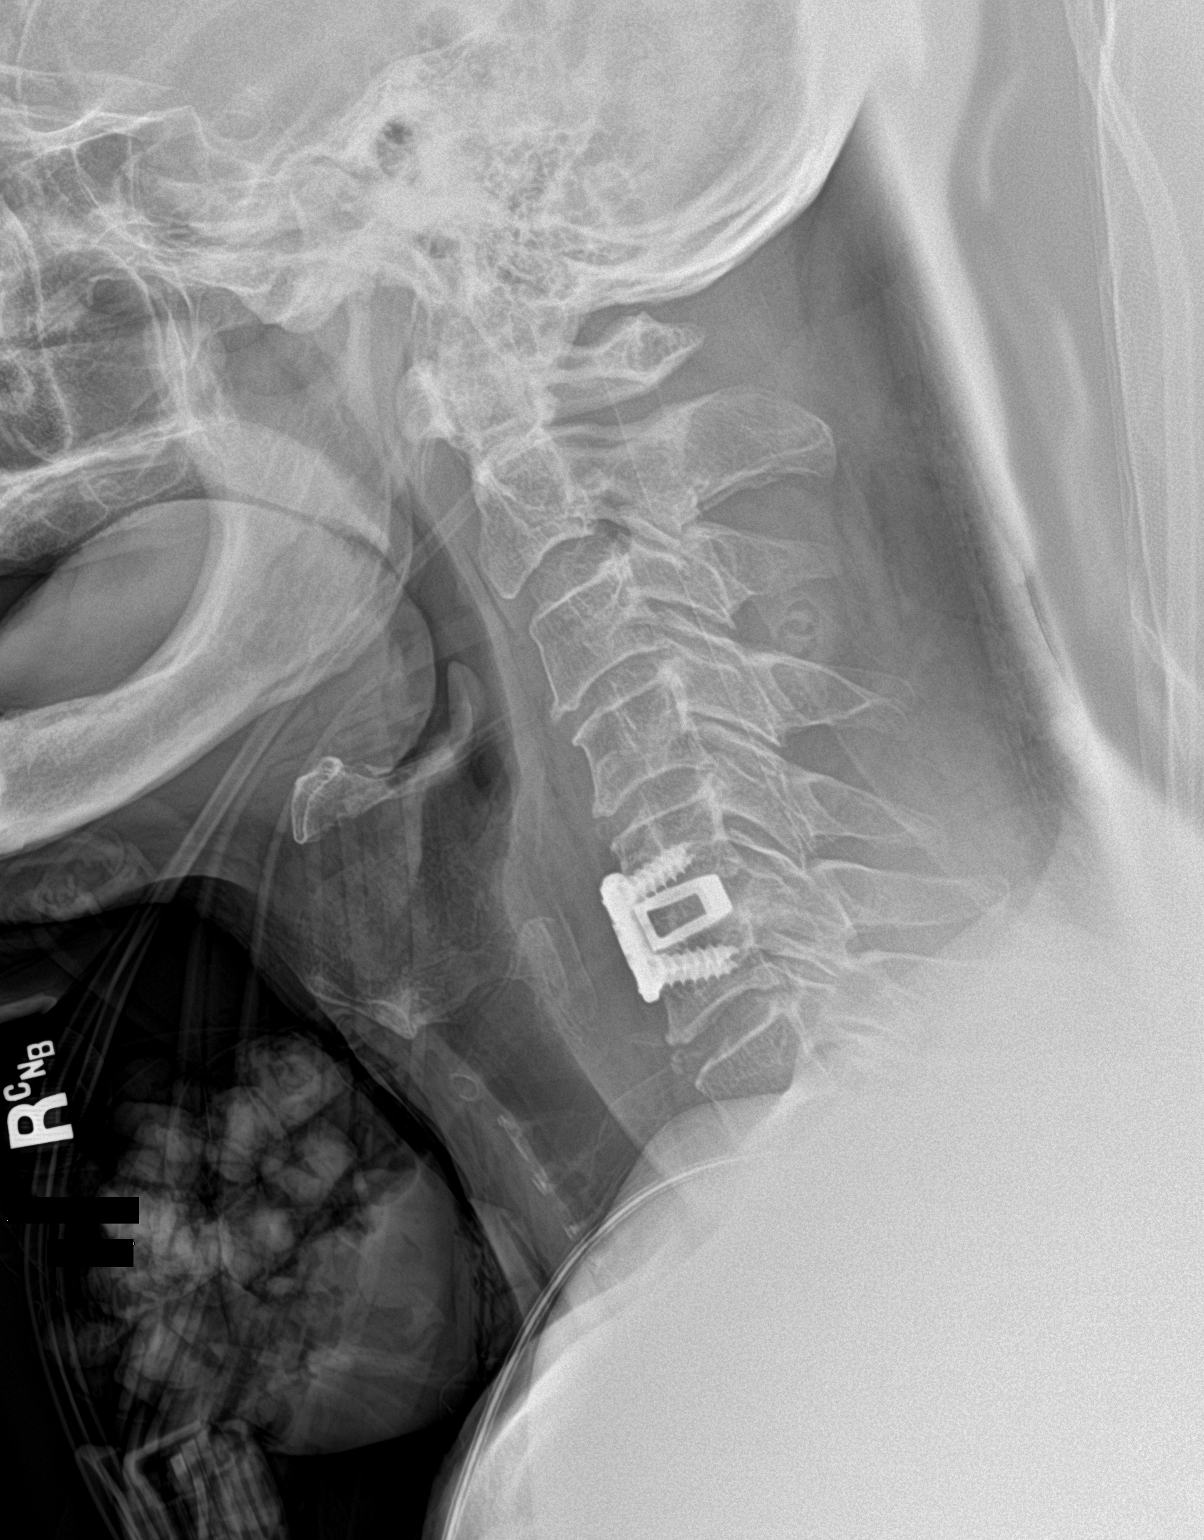

[c-spine ap]
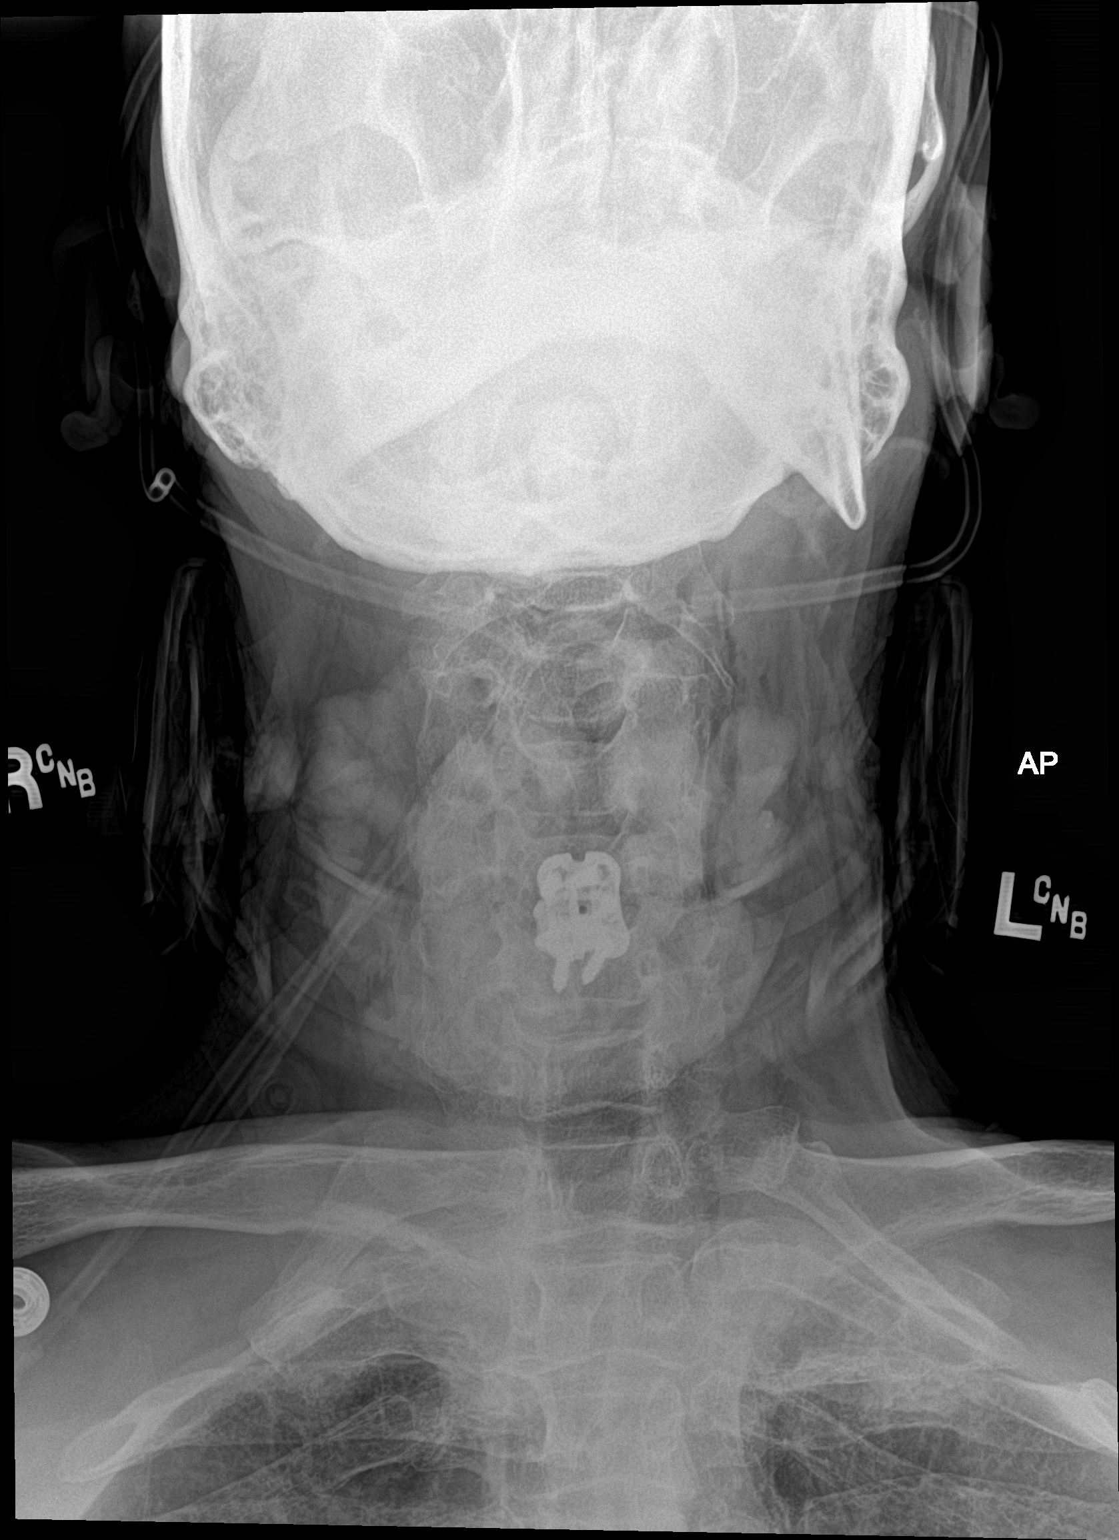

[2 of 2 positions shown; findings below may reference images not displayed]

FINDINGS: The fusion plate and cortical screws at C5-6 appear appropriately
position. The intradiscal device also appears normal in position.
There is mild prevertebral soft tissue swelling. The other cervical
levels exhibit no acute abnormalities.
IMPRESSION: No immediate postprocedure complication following ACDF at C5-6.

## 2022-02-19 DIAGNOSIS — M47817 Spondylosis without myelopathy or radiculopathy, lumbosacral region: Secondary | ICD-10-CM | POA: Diagnosis not present

## 2022-02-19 DIAGNOSIS — Z79899 Other long term (current) drug therapy: Secondary | ICD-10-CM | POA: Diagnosis not present

## 2023-01-28 DIAGNOSIS — Z79899 Other long term (current) drug therapy: Secondary | ICD-10-CM | POA: Diagnosis not present

## 2023-01-28 DIAGNOSIS — M47817 Spondylosis without myelopathy or radiculopathy, lumbosacral region: Secondary | ICD-10-CM | POA: Diagnosis not present
